# Patient Record
Sex: Female | Born: 1985 | Race: White | Hispanic: No | Marital: Married | Smoking: Never smoker
Health system: Southern US, Community
[De-identification: ages and names within clinical notes are randomized; demographics above are authoritative.]

## PROBLEM LIST (undated history)

## (undated) DIAGNOSIS — E049 Nontoxic goiter, unspecified: Secondary | ICD-10-CM

## (undated) DIAGNOSIS — Z789 Other specified health status: Secondary | ICD-10-CM

## (undated) HISTORY — PX: RHINOPLASTY: SUR1284

## (undated) HISTORY — DX: Nontoxic goiter, unspecified: E04.9

---
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
## 2010-05-17 ENCOUNTER — Inpatient Hospital Stay (HOSPITAL_COMMUNITY): Admission: AD | Admit: 2010-05-17 | Payer: Self-pay | Source: Ambulatory Visit | Admitting: Obstetrics and Gynecology

## 2011-10-19 LAB — OB RESULTS CONSOLE ANTIBODY SCREEN: Antibody Screen: NEGATIVE

## 2011-10-19 LAB — OB RESULTS CONSOLE RPR: RPR: NONREACTIVE

## 2011-11-02 LAB — OB RESULTS CONSOLE GC/CHLAMYDIA: Chlamydia: NEGATIVE

## 2012-04-12 LAB — OB RESULTS CONSOLE GBS: GBS: NEGATIVE

## 2012-05-04 NOTE — L&D Delivery Note (Signed)
Delivery Note At 10:11 AM a viable and healthy female was delivered via Vaginal, Spontaneous Delivery (Presentation: ; Occiput Anterior).  APGAR: 8, 9; weight .   Placenta status: Intact, Spontaneous.  Cord: 3 vessels with the following complications: None.  Cord pH: na  Anesthesia: None  Episiotomy: None Lacerations: None Suture Repair: na Est. Blood Loss (mL): 300  Mom to postpartum.  Baby to nursery-stable.  Cyann Venti J 05/10/2012, 11:09 AM

## 2012-05-10 ENCOUNTER — Encounter (HOSPITAL_COMMUNITY): Payer: Self-pay | Admitting: *Deleted

## 2012-05-10 ENCOUNTER — Inpatient Hospital Stay (HOSPITAL_COMMUNITY)
Admission: AD | Admit: 2012-05-10 | Discharge: 2012-05-12 | DRG: 373 | Disposition: A | Discharge status: Home / Self Care | Payer: BC Managed Care – PPO | Source: Ambulatory Visit | Attending: Obstetrics and Gynecology | Admitting: Obstetrics and Gynecology

## 2012-05-10 HISTORY — DX: Other specified health status: Z78.9

## 2012-05-10 LAB — POCT FERN TEST: POCT Fern Test: POSITIVE

## 2012-05-10 LAB — RPR: RPR Ser Ql: NONREACTIVE

## 2012-05-10 LAB — CBC
MCHC: 34.9 g/dL (ref 30.0–36.0)
Platelets: 171 10*3/uL (ref 150–400)
RDW: 13.1 % (ref 11.5–15.5)

## 2012-05-10 MED ORDER — TETANUS-DIPHTH-ACELL PERTUSSIS 5-2.5-18.5 LF-MCG/0.5 IM SUSP
0.5000 mL | Freq: Once | INTRAMUSCULAR | Status: AC
  Administered 2012-05-11: 0.5 mL via INTRAMUSCULAR
  Filled 2012-05-10: qty 0.5

## 2012-05-10 MED ORDER — OXYCODONE-ACETAMINOPHEN 5-325 MG PO TABS: 1.0000 | ORAL_TABLET | ORAL | Status: DC | PRN

## 2012-05-10 MED ORDER — TERBUTALINE SULFATE 1 MG/ML IJ SOLN: 0.2500 mg | Freq: Once | INTRAMUSCULAR | Status: DC | PRN

## 2012-05-10 MED ORDER — WITCH HAZEL-GLYCERIN EX PADS: 1.0000 "application " | MEDICATED_PAD | CUTANEOUS | Status: DC | PRN

## 2012-05-10 MED ORDER — OXYTOCIN BOLUS FROM INFUSION
500.0000 mL | INTRAVENOUS | Status: DC
  Administered 2012-05-10: 500 mL via INTRAVENOUS

## 2012-05-10 MED ORDER — OXYTOCIN 40 UNITS IN LACTATED RINGERS INFUSION - SIMPLE MED
1.0000 m[IU]/min | INTRAVENOUS | Status: DC
  Filled 2012-05-10: qty 1000

## 2012-05-10 MED ORDER — IBUPROFEN 600 MG PO TABS
600.0000 mg | ORAL_TABLET | Freq: Four times a day (QID) | ORAL | Status: DC
  Administered 2012-05-10 – 2012-05-12 (×8): 600 mg via ORAL
  Filled 2012-05-10 (×8): qty 1

## 2012-05-10 MED ORDER — FLEET ENEMA 7-19 GM/118ML RE ENEM: 1.0000 | ENEMA | RECTAL | Status: DC | PRN

## 2012-05-10 MED ORDER — BENZOCAINE-MENTHOL 20-0.5 % EX AERO
1.0000 "application " | INHALATION_SPRAY | CUTANEOUS | Status: DC | PRN
  Filled 2012-05-10: qty 56

## 2012-05-10 MED ORDER — PHENYLEPHRINE 40 MCG/ML (10ML) SYRINGE FOR IV PUSH (FOR BLOOD PRESSURE SUPPORT): 80.0000 ug | PREFILLED_SYRINGE | INTRAVENOUS | Status: DC | PRN

## 2012-05-10 MED ORDER — FENTANYL 2.5 MCG/ML BUPIVACAINE 1/10 % EPIDURAL INFUSION (WH - ANES): 14.0000 mL/h | INTRAMUSCULAR | Status: DC

## 2012-05-10 MED ORDER — LIDOCAINE HCL (PF) 1 % IJ SOLN
30.0000 mL | INTRAMUSCULAR | Status: DC | PRN
  Administered 2012-05-10: 30 mL via SUBCUTANEOUS
  Filled 2012-05-10: qty 30

## 2012-05-10 MED ORDER — SIMETHICONE 80 MG PO CHEW: 80.0000 mg | CHEWABLE_TABLET | ORAL | Status: DC | PRN

## 2012-05-10 MED ORDER — ONDANSETRON HCL 4 MG PO TABS: 4.0000 mg | ORAL_TABLET | ORAL | Status: DC | PRN

## 2012-05-10 MED ORDER — ONDANSETRON HCL 4 MG/2ML IJ SOLN: 4.0000 mg | Freq: Four times a day (QID) | INTRAMUSCULAR | Status: DC | PRN

## 2012-05-10 MED ORDER — LACTATED RINGERS IV SOLN: 500.0000 mL | INTRAVENOUS | Status: DC | PRN

## 2012-05-10 MED ORDER — CITRIC ACID-SODIUM CITRATE 334-500 MG/5ML PO SOLN: 30.0000 mL | ORAL | Status: DC | PRN

## 2012-05-10 MED ORDER — BUTORPHANOL TARTRATE 1 MG/ML IJ SOLN
1.0000 mg | INTRAMUSCULAR | Status: DC | PRN
  Administered 2012-05-10: 1 mg via INTRAVENOUS

## 2012-05-10 MED ORDER — LACTATED RINGERS IV SOLN
INTRAVENOUS | Status: DC
  Administered 2012-05-10: 08:00:00 via INTRAVENOUS

## 2012-05-10 MED ORDER — EPHEDRINE 5 MG/ML INJ: 10.0000 mg | INTRAVENOUS | Status: DC | PRN

## 2012-05-10 MED ORDER — DIBUCAINE 1 % RE OINT
1.0000 "application " | TOPICAL_OINTMENT | RECTAL | Status: DC | PRN
  Filled 2012-05-10: qty 28

## 2012-05-10 MED ORDER — BUTORPHANOL TARTRATE 1 MG/ML IJ SOLN
INTRAMUSCULAR | Status: AC
  Filled 2012-05-10: qty 1

## 2012-05-10 MED ORDER — DIPHENHYDRAMINE HCL 25 MG PO CAPS: 25.0000 mg | ORAL_CAPSULE | Freq: Four times a day (QID) | ORAL | Status: DC | PRN

## 2012-05-10 MED ORDER — DIPHENHYDRAMINE HCL 50 MG/ML IJ SOLN: 12.5000 mg | INTRAMUSCULAR | Status: DC | PRN

## 2012-05-10 MED ORDER — ACETAMINOPHEN 325 MG PO TABS: 650.0000 mg | ORAL_TABLET | ORAL | Status: DC | PRN

## 2012-05-10 MED ORDER — SENNOSIDES-DOCUSATE SODIUM 8.6-50 MG PO TABS
2.0000 | ORAL_TABLET | Freq: Every day | ORAL | Status: DC
  Administered 2012-05-10 – 2012-05-11 (×2): 2 via ORAL

## 2012-05-10 MED ORDER — LANOLIN HYDROUS EX OINT: TOPICAL_OINTMENT | CUTANEOUS | Status: DC | PRN

## 2012-05-10 MED ORDER — IBUPROFEN 600 MG PO TABS
600.0000 mg | ORAL_TABLET | Freq: Four times a day (QID) | ORAL | Status: DC | PRN
  Administered 2012-05-10: 600 mg via ORAL
  Filled 2012-05-10: qty 1

## 2012-05-10 MED ORDER — METHYLERGONOVINE MALEATE 0.2 MG/ML IJ SOLN: 0.2000 mg | INTRAMUSCULAR | Status: DC | PRN

## 2012-05-10 MED ORDER — PRENATAL MULTIVITAMIN CH
1.0000 | ORAL_TABLET | Freq: Every day | ORAL | Status: DC
  Administered 2012-05-10 – 2012-05-11 (×2): 1 via ORAL
  Filled 2012-05-10 (×2): qty 1

## 2012-05-10 MED ORDER — OXYTOCIN 40 UNITS IN LACTATED RINGERS INFUSION - SIMPLE MED: 62.5000 mL/h | INTRAVENOUS | Status: DC

## 2012-05-10 MED ORDER — METHYLERGONOVINE MALEATE 0.2 MG PO TABS: 0.2000 mg | ORAL_TABLET | ORAL | Status: DC | PRN

## 2012-05-10 MED ORDER — ONDANSETRON HCL 4 MG/2ML IJ SOLN: 4.0000 mg | INTRAMUSCULAR | Status: DC | PRN

## 2012-05-10 MED ORDER — LACTATED RINGERS IV SOLN: 500.0000 mL | Freq: Once | INTRAVENOUS | Status: DC

## 2012-05-10 MED ORDER — ZOLPIDEM TARTRATE 5 MG PO TABS: 5.0000 mg | ORAL_TABLET | Freq: Every evening | ORAL | Status: DC | PRN

## 2012-05-10 NOTE — Progress Notes (Signed)
Monica Cooke is a 27 y.o. G1P0 at [redacted]w[redacted]d by LMP admitted for rupture of membranes  Subjective: Comfortable Objective: BP 125/86  Pulse 68  Temp 98 F (36.7 C) (Oral)  Resp 20  Ht 5\' 3"  (1.6 m)  Wt 77.474 kg (170 lb 12.8 oz)  BMI 30.26 kg/m2      FHT:  FHR: 155 bpm, variability: moderate,  accelerations:  Present,  decelerations:  Absent UC:   irregular, every 5-7 minutes SVE:   Dilation: 3 Effacement (%): 90 Station: -1 Exam by:: Monica Coco RN  Labs: No results found for this basename: WBC, HGB, HCT, MCV, PLT    Assessment / Plan: Protracted latent phase  Labor: Pitocin augmentation Preeclampsia:  na Fetal Wellbeing:  Category I Pain Control:  Labor support without medications I/D:  n/a Anticipated MOD:  NSVD  Monica Cooke J 05/10/2012, 7:58 AM

## 2012-05-10 NOTE — H&P (Signed)
NAME:  Monica Cooke, Monica Cooke NO.:  [id number]  MEDICAL RECORD NO.:  [id number]  LOCATION:                                 FACILITY:  PHYSICIAN:  Lenoard Aden, M.D.DATE OF BIRTH:  11-11-1985  DATE OF ADMISSION: DATE OF DISCHARGE:                             HISTORY & PHYSICAL   CHIEF COMPLAINT:  Spontaneous rupture of membranes at 3 a.m.  HISTORY OF PRESENT ILLNESS:  She is a 27 year old white female, G1, P0, at 35 and 2/7th weeks' gestation who presents with spontaneous rupture of membranes at 3 a.m., irregular contractions noted.  ALLERGIES:  METAMUCIL.  SOCIAL HISTORY:  She is a nonsmoker, nondrinker.  Denies domestic or physical violence.  FAMILY HISTORY:  Hypertension.  SURGICAL HISTORY:  Noncontributory.  PHYSICAL EXAMINATION:  GENERAL:  A well-developed, well-nourished, white female, in no acute distress. HEENT:  Normal. NECK:  Supple.  Full range of motion. LUNGS:  Clear. HEART:  Regular rhythm. ABDOMEN:  Soft, gravid, nontender.  Estimated fetal weight 7.5 pounds. Cervix is 3, 80%, vertex, -1. EXTREMITIES:  There are no cords. NEUROLOGIC:  Nonfocal. SKIN:  Intact.  NST is reactive.  Clear amniotic fluid noted.  IMPRESSION:  Term intrauterine pregnancy with spontaneous rupture of membranes.  PLAN:  Pitocin augmentation, epidural as needed.  Anticipate attempts at vaginal delivery.     Lenoard Aden, M.D.    RJT/MEDQ  D:  05/10/2012  T:  05/10/2012  Job:  [id number]

## 2012-05-10 NOTE — MAU Note (Signed)
Pt G1 at 39.1wks leaking clear fluid since 0338 and having contractions.

## 2012-05-10 NOTE — MAU Note (Signed)
Dr. Billy Coast notified of pt, orders rec'd.

## 2012-05-11 ENCOUNTER — Encounter (HOSPITAL_COMMUNITY): Payer: Self-pay | Admitting: *Deleted

## 2012-05-11 LAB — CBC
MCH: 31.8 pg (ref 26.0–34.0)
MCHC: 34.1 g/dL (ref 30.0–36.0)
MCV: 93.3 fL (ref 78.0–100.0)
Platelets: 160 10*3/uL (ref 150–400)
RBC: 3.74 MIL/uL — ABNORMAL LOW (ref 3.87–5.11)
RDW: 13.4 % (ref 11.5–15.5)

## 2012-05-11 NOTE — Progress Notes (Signed)
Patient ID: Monica Cooke, female   DOB: 1985/05/18, 27 y.o.   MRN: [id number] PPD # 1  Subjective: Pt reports feeling well/ Pain controlled with ibuprofen Tolerating po/ Voiding without problems/ No n/v Bleeding is light Newborn info:  Information for the patient's newborn:  Demos, Girl [name] [[id number]]  female Feeding: breast [name])   Objective:  VS: Blood pressure 108/69, pulse 77, temperature 98.1 F (36.7 C), temperature source Oral, resp. rate 18.    Basename 05/11/12 0530 05/10/12 0725  WBC 12.1* 11.1*  HGB 11.9* 13.9  HCT 34.9* 39.8  PLT 160 171    Blood type: --/--/O POS (01/07 0725) Rubella: Immune (06/17 0000)    Physical Exam:  General: A & O x 3  alert, cooperative and no distress CV: Regular rate and rhythm Resp: clear Abdomen: soft, nontender, normal bowel sounds Uterine Fundus: firm, below umbilicus, nontender Perineum: intact Lochia: minimal Ext: edema trace and Homans sign is negative, no sign of DVT   A/P: PPD # 1/ G1P1001/ S/P:spontaneous vaginal delivery Doing well Continue routine post partum orders Anticipate D/C home in AM    Demetrius Revel, MSN, [facility] 05/11/2012, 9:47 AM

## 2012-05-12 MED ORDER — IBUPROFEN 600 MG PO TABS: 300.0000 mg | ORAL_TABLET | Freq: Four times a day (QID) | ORAL | Status: DC | PRN

## 2012-05-12 NOTE — Progress Notes (Signed)
Post Partum Day 2 NSVD without complications viable female. Subjective: no complaints, up ad lib without syncope, voiding, tolerating PO, + flatus, +BM  Pain well controlled with po meds, taking motrin and percocet  BF: on demand Mood stable, bonding well    Objective: Blood pressure 107/70, pulse 83, temperature 98.5 F (36.9 C), temperature source Oral, resp. rate 18, height 5\' 3"  (1.6 m), weight 170 lb 12.8 oz (77.474 kg), SpO2 98.00%, unknown if currently breastfeeding.  Physical Exam:  General: alert, cooperative and no distress Breasts: Nipple tenderness but coping well - nipples are not cracked Lungs: CTAB Heart: RRR Lochia: appropriate, Min, Rubra Uterine Fundus: firm, -3 /u Perineum: intact but slight bruised- healing well DVT Evaluation: No evidence of DVT seen on physical exam. Negative Homan's sign. No cords or calf tenderness. No significant calf/ankle edema.   Basename 05/11/12 0530 05/10/12 0725  HGB 11.9* 13.9  HCT 34.9* 39.8    Assessment/Plan: Plan for discharge tomorrow       LOS: 2 days   Alp Goldwater 05/12/2012, 9:32 AM

## 2012-05-12 NOTE — Discharge Summary (Signed)
Physician Discharge Summary  Patient ID: LACYE MCCARN MRN: [id number] DOB/AGE: 1986-04-22 26 y.o.  Admit date: 05/10/2012 Discharge date: 05/12/2012  Admission Diagnoses: Patient admitted with SROM and active labor. GBS - neg  Discharge Diagnoses:  Active Problems:  NSVD (normal spontaneous vaginal delivery)   Discharged Condition: stable  Hospital Course: Active labor with NSVD.Uncomplicated course.  Consults: None  Significant Diagnostic Studies: labs: Routine Prenatal Labs - normal and radiology: Ultrasound: anatomy normal  Treatments: analgesia: ibuprofen   Discharge Exam: Blood pressure 107/70, pulse 83, temperature 98.5 F (36.9 C), temperature source Oral, resp. rate 18, height 5\' 3"  (1.6 m), weight 170 lb 12.8 oz (77.474 kg), SpO2 98.00%, unknown if currently breastfeeding.  Physical Examination:  General appearance: alert, cooperative and no distress Affect: AAO x 3 Lungs: CTAB Breasts: nipples slightly tender but not cracked. CV: RRR Abdomen: Soft, N/T with flatus ++ Fundus: -3/u, Firm Lochia: min, rubra. GI: tolerating normal diet GU: no problems voiding Extremities: No swelling/ no edema Bilaterally.  Disposition: Final discharge disposition not confirmed - to home with baby.  Discharge Orders    Future Orders Please Complete By Expires   Diet general      Discharge instructions      Comments:   Per [facility] Booklet       Medication List     As of 05/12/2012  9:51 AM    TAKE these medications         ibuprofen 600 MG tablet   Commonly known as: ADVIL,MOTRIN   Take 0.5 tablets (300 mg total) by mouth every 6 (six) hours as needed for pain.      multivitamin-prenatal 27-0.8 MG Tabs   Take 1 tablet by mouth daily.           Follow-up Information    Follow up with [facility] Schedule an appointment as soon as possible for a visit in 6 weeks. (As needed)    Contact information:   [address] [place]  [place] [address] [phone]         Signed: Earl Gala, CNM. 05/12/2012, 9:51 AM

## 2014-03-05 ENCOUNTER — Encounter (HOSPITAL_COMMUNITY): Payer: Self-pay | Admitting: *Deleted

## 2016-02-24 ENCOUNTER — Ambulatory Visit (INDEPENDENT_AMBULATORY_CARE_PROVIDER_SITE_OTHER): Payer: Self-pay | Admitting: Internal Medicine

## 2016-02-24 ENCOUNTER — Encounter: Payer: Self-pay | Admitting: Internal Medicine

## 2016-02-24 VITALS — BP 110/80 | HR 82 | Ht 63.0 in | Wt 145.0 lb

## 2016-02-24 DIAGNOSIS — E041 Nontoxic single thyroid nodule: Secondary | ICD-10-CM

## 2016-02-24 LAB — T3, FREE: T3 FREE: 3.2 pg/mL (ref 2.3–4.2)

## 2016-02-24 LAB — T4, FREE: FREE T4: 0.88 ng/dL (ref 0.60–1.60)

## 2016-02-24 LAB — TSH: TSH: 0.27 u[IU]/mL — AB (ref 0.35–4.50)

## 2016-02-24 NOTE — Progress Notes (Addendum)
Patient ID: Monica MouseLaura C Wegner, female   DOB: [birth year], 30 y.o.   MRN: [id number]    HPI  Monica Cooke is a 30 y.o.-year-old female, referred by Dr. Billy Coastaavon, for evaluation for a left thyroid nodule.   She had a thyroid nodule noticed at a physical exam 10/2014 by PCP. She had a [facility]: 2.3 cm L nodule. She was sent to ENT, but did not go as she lost her insurance.  She now has insurance >> saw Dr. Billy Coastaavon >> he also felt the thyroid nodule >> referred to endocrinology.  No Thyroid U/S images or report available for review  Pt does feel the nodule when she touches her neck, but she denies: - hoarseness - dysphagia - choking - SOB with lying down She does clear her throat  - always had this.   I reviewed pt's thyroid tests: 10/16/2014: TSH 1.35, free T4 1.08, free T3 3.26  Pt c/o: - + fatigue - No heat intolerance/cold intolerance - no tremors - + Occasional palpitations - no anxiety/depression - no hyperdefecation/constipation - + Both: weight loss/weight gain - no dry skin - no hair loss  No FH of thyroid ds. No FH of thyroid cancer. No h/o radiation tx to head or neck.  No seaweed or kelp. No recent contrast studies. No steroid use. No herbal supplements. No Biotin supplements but took Hair, Skin and Nails vitamins - stopped 2 mo ago.  Pt also has a history of mother - goiter.  She exercises 2-3 times a week: Walking, Zumba, Pilates.   She also has a history of loss of consciousness with dehydration when she was younger.  She her husband would like to start trying to conceive again.  ROS: Constitutional: See history of present illness Eyes: no blurry vision, no xerophthalmia ENT: no sore throat, see history of present illness Cardiovascular: no CP/SOB/+ occasional palpitations/no leg swelling Respiratory: no cough/SOB Gastrointestinal: no N/V/D/C Musculoskeletal: no muscle/joint aches Skin: no rashes Neurological: no  tremors/numbness/tingling/dizziness Psychiatric: no depression/anxiety  Past Medical History:  Diagnosis Date  . No pertinent past medical history    Past Surgical History:  Procedure Laterality Date  . RHINOPLASTY     Social History   Social History  . Marital status: Married    Spouse name: N/A  . Number of children: 1   Occupational History  . LPN   Social History Main Topics  . Smoking status: Never Smoker  . Smokeless tobacco: No  . Alcohol use Drinks 1-2 glasses of wine occasionally   . Drug use: No   Current Outpatient Prescriptions on File Prior to Visit  Medication Sig Dispense Refill  . ibuprofen (ADVIL,MOTRIN) 600 MG tablet Take 0.5 tablets (300 mg total) by mouth every 6 (six) hours as needed for pain. 30 tablet    Allergies  Allergen Reactions  . Metamucil [Psyllium] Shortness Of Breath   Family History  Problem Relation Age of Onset  . Thyroid disease Mother   . Hyperlipidemia Mother   . Hypertension Mother   . Hyperlipidemia Father   . Hypertension Father   . Diabetes Father   . Other Neg Hx     PE: BP 110/80 (BP Location: Left Arm, Patient Position: Sitting)   Pulse 82   Ht 5\' 3"  (1.6 m)   Wt 145 lb (65.8 kg)   LMP 02/18/2016   SpO2 96%   BMI 25.69 kg/m  Wt Readings from Last 3 Encounters:  02/24/16 145 lb (65.8 kg)  05/10/12 170 lb 12.8 oz (77.5 kg)   Constitutional: normal weight, in NAD Eyes: PERRLA, EOMI, no exophthalmos ENT: moist mucous membranes, + Large mobile nodule noticed with swallowing and on palpation: Central low cervical region with extension to the left, no cervical lymphadenopathy Cardiovascular: RRR, No MRG Respiratory: CTA B Gastrointestinal: abdomen soft, NT, ND, BS+ Musculoskeletal: no deformities, strength intact in all 4;  Skin: moist, warm, no rashes Neurological: no tremor with outstretched hands, DTR normal in all 4  ASSESSMENT: 1. Left thyroid nodule  PLAN: 1.  - Patient with a history of 0.3 cm  nodule in the left thyroid lobe reportedly. Unfortunately, I do not have records of the thyroid ultrasound from 10/2014. We will try to obtain this. However, we discussed that we will need to obtain another ultrasound to see if the nodule changed over time. I pointed out that the dominant nodules are large, this being a risk factor for cancer.  We discussed about other possible worrisome characteristics - I will need to review the images to see if her nodule has the following: - hypoechogenicity - Presence of microcalcifications - Presence of internal blood flow - more wide than tall - Infiltrative margins Pt does not have a thyroid cancer family history or a personal history of RxTx to head/neck. All these would favor benignity.  - the only way that we can tell exactly if it is cancer or not is by doing a thyroid biopsy (FNA). I explained what the test entails. We may need a thyroid biopsy if the nodule is not cystic. If the nodule is cystic, since she does not have neck compression symptoms, I would not recommend drainage/FNA for now. However, if the nodule is solid, we will most likely need a biopsy. She agrees with this.  - We discussed about what the biopsy entails and possible results. - We also discussed about the possible diagnosis of thyroid cancer, the fact that it is very indolent and usually does not decreased life expectancy especially in somebody this young. - I explained that this is not cancer, we can continue to follow her on a yearly basis, and check another ultrasound in another year or 2. - for today, will check her TFTs - I'll see her back in a year, assuming her FNA is normal. If FNA abnormal, we will meet sooner.  - I advised pt to join my chart and I will send her the results through there   Orders Placed This Encounter  Procedures  . T4, free  . T3, free  . TSH   Component     Latest Ref Rng & Units 02/24/2016  T4,Free(Direct)     0.60 - 1.60 ng/dL 1.61   Triiodothyronine,Free,Serum     2.3 - 4.2 pg/mL 3.2  TSH     0.35 - 4.50 uIU/mL 0.27 (L)   As TSH is low (Subclinical hyperthyroidism) >> will get a thyroid uptake and scan first rather than a thyroid U/S.  Addendum, 06/29/16 TFTs normalized in 05/2016, therefore, thyroid ultrasound was obtained >> small nodules, not worrisome: US SOFT TISSUE HEAD AND NECK  Order: [id number]  Status:  Final result Visible to patient:  No (Not Released) Dx:  Thyroid nodule  Details   Reading Physician Reading Date Result Priority  Gilmer Mor, DO 06/26/2016   Narrative    CLINICAL DATA: 30 year old female with a history of thyroid nodule  EXAM: THYROID ULTRASOUND  TECHNIQUE: Ultrasound examination of the thyroid gland and adjacent soft tissues was performed.  COMPARISON: 10/26/2014  FINDINGS: Parenchymal Echotexture: Mildly heterogenous  Isthmus: 0.5 cm  Right lobe: 6.1 cm x 1.0 cm x 1.6 cm  Left lobe: 6.1 cm x 1.8 cm x 2.8 cm  _________________________________________________________  Estimated total number of nodules >/= 1 cm: 3  Number of spongiform nodules >/= 2 cm not described below (TR1): 0  Number of mixed cystic and solid nodules >/= 1.5 cm not described below (TR2): 0  Nodule # 1:  Location: Isthmus; Mid  Maximum size: 4.8 cm; Other 2 dimensions: 1.5 cm x 2.8 cm  Composition: cystic/almost completely cystic (0)  Echogenicity: isoechoic (1)  Shape: not taller-than-wide (0)  Margins: smooth (0)  Echogenic foci: none (0)  ACR TI-RADS total points: 1.  ACR TI-RADS risk category: TR1 (0-1 points).  ACR TI-RADS recommendations:  Colloid cyst/nodule does not meet criteria for biopsy or surveillance.  Nodule # 1:  Location: Right; Superior  Maximum size: 0.9 cm; Other 2 dimensions: 0.4 cm x 0.5 cm  Composition: cystic/almost completely cystic (0)  Echogenicity: anechoic (0)  Shape: not taller-than-wide (0)  Margins: smooth (0)  Echogenic foci:  none (0)  ACR TI-RADS total points: 0.  ACR TI-RADS risk category: TR1 (0-1 points).  ACR TI-RADS recommendations:  Colloid cyst/nodule does not meet criteria for biopsy or surveillance  Nodule # 2:  Location: Right; Mid  Maximum size: 0.5 cm; Other 2 dimensions: 0.3 cm x 0.4 cm  Composition: cystic/almost completely cystic (0)  Echogenicity: anechoic (0)  Shape: not taller-than-wide (0)  Margins: smooth (0)  Echogenic foci: none (0)  ACR TI-RADS total points: 0.  ACR TI-RADS risk category: TR1 (0-1 points).  ACR TI-RADS recommendations:  Cystic nodule does not meet criteria for surveillance or biopsy  Nodule # 3:  Location: Right; Inferior  Maximum size: 0.7 cm; Other 2 dimensions: 0.5 cm x 0.5 cm  Composition: cystic/almost completely cystic (0)  Echogenicity: anechoic (0)  Shape: not taller-than-wide (0)  Margins: smooth (0)  Echogenic foci: none (0)  ACR TI-RADS total points: 0.  ACR TI-RADS risk category: TR1 (0-1 points).  ACR TI-RADS recommendations:  Cystic/colloid nodule does not meet criteria for surveillance or biopsy  Nodule # 1:  Location: Left; Mid  Maximum size: 2.4 cm; Other 2 dimensions: 1.3 cm x 1.7 cm  Composition: cystic/almost completely cystic (0)  Echogenicity: anechoic (0)  Shape: not taller-than-wide (0)  Margins: smooth (0)  Echogenic foci: none (0)  ACR TI-RADS total points: 0.  ACR TI-RADS risk category: TR1 (0-1 points).  ACR TI-RADS recommendations:  Cystic/colloid nodule does not meet criteria for surveillance or biopsy  Nodule # 2:  Location: Left; Inferior  Maximum size: 1.3 cm; Other 2 dimensions: 0.8 cm x 1.3 cm  Composition: mixed cystic and solid (1)  Echogenicity: isoechoic (1)  Shape: not taller-than-wide (0)  Margins: smooth (0)  Echogenic foci: none (0)  ACR TI-RADS total points: 2.  ACR TI-RADS risk category: TR2 (2 points).  ACR TI-RADS recommendations:  Cystic nodule  does not meet criteria for surveillance or biopsy  IMPRESSION: Multiple cystic/ colloid nodules, with no nodule meeting criteria for biopsy or surveillance, as designated by the newly established ACR TI-RADS criteria.  Recommendations follow those established by the new ACR TI-RADS criteria (J Am Coll Radiol 2017;14:587-595).  Signed,  Yvone Neu. Loreta Ave, DO  [facility]  [facility]   Electronically Signed By: Gilmer Mor D.O. On: 06/26/2016 17:06        Carlus Pavlov, MD PhD  [facility]

## 2016-02-24 NOTE — Patient Instructions (Signed)
Please stop at the lab.  We will schedule a thyroid U/S after results are back.  Please return in 1 year.   Thyroid Biopsy The thyroid gland is a butterfly-shaped gland located in the front of the neck. It produces hormones that affect metabolism, growth and development, and body temperature. Thyroid biopsy is a procedure in which small samples of tissue or fluid are removed from the thyroid gland. The samples are then looked at under a microscope to check for abnormalities. This procedure is done to determine the cause of thyroid problems. It may be done to check for infection, cancer, or other thyroid problems. Two methods may be used for a thyroid biopsy. In one method, a thin needle is inserted through the skin and into the thyroid gland. In the other method, an open incision is made through the skin. LET [facility] PROVID[facility] KNOW ABOUT:   Any allergies you have.  All medicines you are taking, including vitamins, herbs, eye drops, creams, and over-the-counter medicines.  Previous problems you or members of your family have had with the use of anesthetics.  Any blood disorders you have.  Previous surgeries you have had.  Medical conditions you have. RISKS AND COMPLICATIONS Generally, this is a safe procedure. However, problems can occur and include:  Bleeding from the procedure site.  Infection.  Injury to structures near the thyroid gland. BEFORE THE PROCEDURE   Ask your health care provider about:  Changing or stopping your regular medicines. This is especially important if you are taking diabetes medicines or blood thinners.  Taking medicines such as aspirin and ibuprofen. These medicines can thin your blood. Do not take these medicines before your procedure if your health care provider asks you not to.  Do not eat or drink anything after midnight on the night before the procedure or as directed by your health care provider.  You may have a blood sample  taken. PROCEDURE Either of these methods may be used to perform a thyroid biopsy:  Fine needle biopsy. You may be given medicine to help you relax (sedative). You will be asked to lie on your back with your head tipped backward to extend your neck. An area on your neck will be cleaned. A needle will then be inserted through the skin of your neck. You may be asked to avoid coughing, talking, swallowing, or making sounds during some portions of the procedure. The needle will be withdrawn once the tissue or fluid samples have been removed. Pressure may be applied to your neck to reduce swelling and ensure that bleeding has stopped. The samples will be sent to a lab for examination.  Open biopsy. You will be given medicine to make you sleep (general anesthetic). An incision will be made in your neck. A sample of thyroid tissue will be removed using surgical tools. The tissue sample will be sent for examination. In some cases, the sample may be examined during the biopsy. If that is done and cancer cells are found, some or all of the thyroid gland may be removed. The incision will be closed with stitches. AFTER THE PROCEDURE   Your recovery will be assessed and monitored.  You may have soreness and tenderness at the site of the biopsy. This should go away after a few days.  If you had an open biopsy, you may have a hoarse voice or sore throat for a couple days.  It is your responsibility to get your test results.   This information is  not intended to replace advice given to you by your health care provider. Make sure you discuss any questions you have with your health care provider.   Document Released: 02/15/2007 Document Revised: 05/11/2014 Document Reviewed: 07/13/2013 Elsevier Interactive Patient Education [employer]

## 2016-02-25 ENCOUNTER — Telehealth: Payer: Self-pay

## 2016-02-25 NOTE — Telephone Encounter (Signed)
Called patient and spoke about lab results. Advised of uptake and scan, no other questions at this time.

## 2016-03-30 ENCOUNTER — Encounter (HOSPITAL_COMMUNITY): Payer: BLUE CROSS/BLUE SHIELD

## 2016-03-31 ENCOUNTER — Encounter (HOSPITAL_COMMUNITY): Payer: BLUE CROSS/BLUE SHIELD

## 2016-05-01 ENCOUNTER — Telehealth: Payer: Self-pay | Admitting: Internal Medicine

## 2016-05-01 NOTE — Telephone Encounter (Signed)
Patient stated she is ready to reschedule her appt for Monica Cooke - NM THYROID SNG UPTAKE W/IMAGING.  Please advise

## 2016-05-06 ENCOUNTER — Telehealth: Payer: Self-pay

## 2016-05-06 DIAGNOSIS — E041 Nontoxic single thyroid nodule: Secondary | ICD-10-CM

## 2016-05-06 NOTE — Telephone Encounter (Signed)
Called and left message for patient that she needed labs drawn first before uptake and scan. Gave call back number to office to schedule. Lab orders placed.

## 2016-05-06 NOTE — Telephone Encounter (Signed)
Let;s have her back for labs first: TSH, fT4 and fT3

## 2016-05-06 NOTE — Telephone Encounter (Signed)
Called and notified patient she needed labs drawn first before uptake and scan. Gave call back number to office to schedule. Lab orders placed.

## 2016-05-19 ENCOUNTER — Telehealth: Payer: Self-pay

## 2016-05-19 NOTE — Telephone Encounter (Signed)
Called patient and spoke with her about no showing the uptake and scan. Patient states that she found out even with insurance it was going to be $800, and at Christmas time she could not afford to do that at the time. Patient did schedule a lab appointment since she had not done that either and is coming next week to get those drawn since orders are still good. Would like to reschedule the uptake and scan if it is still needed. Thank you!  

## 2016-05-19 NOTE — Telephone Encounter (Signed)
Called patient and spoke with her about no showing the uptake and scan. Patient states that she found out even with insurance it was going to be $800, and at Christmas time she could not afford to do that at the time. Patient did schedule a lab appointment since she had not done that either and is coming next week to get those drawn since orders are still good. Would like to reschedule the uptake and scan if it is still needed. Thank you!

## 2016-05-19 NOTE — Telephone Encounter (Signed)
Okay, noted, let's get those labs checked again and then will decide about the uptake and scan.

## 2016-05-19 NOTE — Telephone Encounter (Signed)
Monica Cooke, Can you please try to get in touch with her again? I don't think she got our messages as she no showed the thyroid uptake and scan.

## 2016-05-25 ENCOUNTER — Other Ambulatory Visit (INDEPENDENT_AMBULATORY_CARE_PROVIDER_SITE_OTHER): Payer: BLUE CROSS/BLUE SHIELD

## 2016-05-25 DIAGNOSIS — E041 Nontoxic single thyroid nodule: Secondary | ICD-10-CM | POA: Diagnosis not present

## 2016-05-25 LAB — TSH: TSH: 0.7 u[IU]/mL (ref 0.35–4.50)

## 2016-05-25 LAB — T4, FREE: FREE T4: 0.74 ng/dL (ref 0.60–1.60)

## 2016-05-25 LAB — T3, FREE: T3 FREE: 2.6 pg/mL (ref 2.3–4.2)

## 2016-05-26 NOTE — Telephone Encounter (Signed)
-----   Message from Carlus Pavlovristina Gherghe, MD sent at 05/25/2016  5:25 PM EST ----- Raynelle FanningJulie, can you please call pt: Her TFTs have normalized. We can go ahead with a thyroid ultrasound rather than the uptake and scan. Does she agree to have the thyroid ultrasound?

## 2016-05-26 NOTE — Telephone Encounter (Signed)
Called patient and gave results. Patient would like to have the ultrasound done, and I advised they would contact her with an appointment when you placed the order. Patient had no other questions at this time.

## 2016-05-27 ENCOUNTER — Other Ambulatory Visit: Payer: Self-pay | Admitting: Internal Medicine

## 2016-05-27 DIAGNOSIS — E041 Nontoxic single thyroid nodule: Secondary | ICD-10-CM

## 2016-05-27 NOTE — Telephone Encounter (Signed)
I ordered the US.

## 2016-06-26 ENCOUNTER — Ambulatory Visit
Admission: RE | Admit: 2016-06-26 | Discharge: 2016-06-26 | Disposition: A | Discharge status: Home / Self Care | Payer: BLUE CROSS/BLUE SHIELD | Source: Ambulatory Visit | Attending: Internal Medicine | Admitting: Internal Medicine

## 2016-06-26 DIAGNOSIS — E041 Nontoxic single thyroid nodule: Secondary | ICD-10-CM | POA: Diagnosis not present

## 2016-06-30 ENCOUNTER — Telehealth: Payer: Self-pay

## 2016-06-30 NOTE — Telephone Encounter (Signed)
-----   Message from Carlus Pavlovristina Gherghe, MD sent at 06/30/2016 12:53 PM EST ----- Raynelle FanningJulie, can you please call pt: She has several thyroid cysts, of which, the largest one in the front of the thyroid (in the isthmus). No worrisome features. We do not need to Bx them or even to follow them unless she develops neck compression sxs; problems with swallowing, cough, choking, shortness of breath with lying down.

## 2016-06-30 NOTE — Telephone Encounter (Signed)
Called patient and gave lab results. Patient had no questions or concerns.  

## 2016-07-20 DIAGNOSIS — J111 Influenza due to unidentified influenza virus with other respiratory manifestations: Secondary | ICD-10-CM | POA: Diagnosis not present

## 2016-09-10 DIAGNOSIS — Z3201 Encounter for pregnancy test, result positive: Secondary | ICD-10-CM | POA: Diagnosis not present

## 2016-10-08 DIAGNOSIS — Z3689 Encounter for other specified antenatal screening: Secondary | ICD-10-CM | POA: Diagnosis not present

## 2016-10-08 DIAGNOSIS — Z3481 Encounter for supervision of other normal pregnancy, first trimester: Secondary | ICD-10-CM | POA: Diagnosis not present

## 2016-10-08 LAB — OB RESULTS CONSOLE RPR: RPR: NONREACTIVE

## 2016-10-08 LAB — OB RESULTS CONSOLE ABO/RH: RH Type: POSITIVE

## 2016-10-08 LAB — OB RESULTS CONSOLE GC/CHLAMYDIA
CHLAMYDIA, DNA PROBE: NEGATIVE
Gonorrhea: NEGATIVE

## 2016-10-08 LAB — OB RESULTS CONSOLE HEPATITIS B SURFACE ANTIGEN: Hepatitis B Surface Ag: NEGATIVE

## 2016-10-08 LAB — OB RESULTS CONSOLE ANTIBODY SCREEN: ANTIBODY SCREEN: NEGATIVE

## 2016-10-08 LAB — OB RESULTS CONSOLE RUBELLA ANTIBODY, IGM: Rubella: IMMUNE

## 2016-10-08 LAB — OB RESULTS CONSOLE HIV ANTIBODY (ROUTINE TESTING): HIV: NONREACTIVE

## 2016-10-09 DIAGNOSIS — Z3201 Encounter for pregnancy test, result positive: Secondary | ICD-10-CM | POA: Diagnosis not present

## 2016-10-19 DIAGNOSIS — Z36 Encounter for antenatal screening for chromosomal anomalies: Secondary | ICD-10-CM | POA: Diagnosis not present

## 2016-10-19 DIAGNOSIS — Z3689 Encounter for other specified antenatal screening: Secondary | ICD-10-CM | POA: Diagnosis not present

## 2016-10-19 DIAGNOSIS — Z3481 Encounter for supervision of other normal pregnancy, first trimester: Secondary | ICD-10-CM | POA: Diagnosis not present

## 2016-10-19 DIAGNOSIS — Z3682 Encounter for antenatal screening for nuchal translucency: Secondary | ICD-10-CM | POA: Diagnosis not present

## 2016-10-19 DIAGNOSIS — Z3491 Encounter for supervision of normal pregnancy, unspecified, first trimester: Secondary | ICD-10-CM | POA: Diagnosis not present

## 2016-11-11 DIAGNOSIS — Z3481 Encounter for supervision of other normal pregnancy, first trimester: Secondary | ICD-10-CM | POA: Diagnosis not present

## 2016-11-11 DIAGNOSIS — Z3682 Encounter for antenatal screening for nuchal translucency: Secondary | ICD-10-CM | POA: Diagnosis not present

## 2017-02-23 ENCOUNTER — Ambulatory Visit: Payer: Self-pay | Admitting: Internal Medicine

## 2017-04-15 LAB — OB RESULTS CONSOLE GBS: GBS: POSITIVE

## 2017-05-04 NOTE — L&D Delivery Note (Signed)
Delivery Note At 4:07 PM a viable and healthy female was delivered via Vaginal, Spontaneous (Presentation: LOA  ).  APGAR: 8, 9; weight pending .   Placenta status: spontaneous, intact.  Cord:  with the following complications: none.  Cord pH: na  Anesthesia:  local Episiotomy:  none Lacerations:  na Suture Repair: na Est. Blood Loss (mL):  100  Mom to postpartum.  Baby to Couplet care / Skin to Skin.  Kamilla Hands J 05/14/2017, 4:24 PM

## 2017-05-12 ENCOUNTER — Other Ambulatory Visit: Payer: Self-pay | Admitting: Obstetrics and Gynecology

## 2017-05-13 ENCOUNTER — Encounter (HOSPITAL_COMMUNITY): Payer: Self-pay | Admitting: *Deleted

## 2017-05-13 ENCOUNTER — Telehealth (HOSPITAL_COMMUNITY): Payer: Self-pay | Admitting: *Deleted

## 2017-05-13 NOTE — Telephone Encounter (Signed)
Preadmission screen  

## 2017-05-14 ENCOUNTER — Encounter (HOSPITAL_COMMUNITY): Payer: Self-pay

## 2017-05-14 ENCOUNTER — Inpatient Hospital Stay (HOSPITAL_COMMUNITY)
Admission: RE | Admit: 2017-05-14 | Discharge: 2017-05-16 | DRG: 807 | Disposition: A | Discharge status: Home / Self Care | Payer: Managed Care, Other (non HMO) | Source: Ambulatory Visit | Attending: Obstetrics and Gynecology | Admitting: Obstetrics and Gynecology

## 2017-05-14 DIAGNOSIS — Z3A40 40 weeks gestation of pregnancy: Secondary | ICD-10-CM

## 2017-05-14 DIAGNOSIS — Z3490 Encounter for supervision of normal pregnancy, unspecified, unspecified trimester: Secondary | ICD-10-CM | POA: Diagnosis present

## 2017-05-14 DIAGNOSIS — O99824 Streptococcus B carrier state complicating childbirth: Secondary | ICD-10-CM | POA: Diagnosis present

## 2017-05-14 HISTORY — DX: Other specified health status: Z78.9

## 2017-05-14 LAB — CBC
HEMATOCRIT: 37.9 % (ref 36.0–46.0)
HEMOGLOBIN: 13.4 g/dL (ref 12.0–15.0)
MCH: 32.2 pg (ref 26.0–34.0)
MCHC: 35.4 g/dL (ref 30.0–36.0)
MCV: 91.1 fL (ref 78.0–100.0)
Platelets: 182 10*3/uL (ref 150–400)
RBC: 4.16 MIL/uL (ref 3.87–5.11)
RDW: 13.3 % (ref 11.5–15.5)
WBC: 8.1 10*3/uL (ref 4.0–10.5)

## 2017-05-14 LAB — TYPE AND SCREEN
ABO/RH(D): O POS
Antibody Screen: NEGATIVE

## 2017-05-14 LAB — RPR: RPR: NONREACTIVE

## 2017-05-14 MED ORDER — LACTATED RINGERS IV SOLN
INTRAVENOUS | Status: DC
  Administered 2017-05-14: 08:00:00 via INTRAVENOUS

## 2017-05-14 MED ORDER — METHYLERGONOVINE MALEATE 0.2 MG/ML IJ SOLN: 0.2000 mg | INTRAMUSCULAR | Status: DC | PRN

## 2017-05-14 MED ORDER — TERBUTALINE SULFATE 1 MG/ML IJ SOLN
0.2500 mg | Freq: Once | INTRAMUSCULAR | Status: DC | PRN
  Filled 2017-05-14: qty 1

## 2017-05-14 MED ORDER — ONDANSETRON HCL 4 MG/2ML IJ SOLN: 4.0000 mg | Freq: Four times a day (QID) | INTRAMUSCULAR | Status: DC | PRN

## 2017-05-14 MED ORDER — OXYTOCIN 40 UNITS IN LACTATED RINGERS INFUSION - SIMPLE MED: 1.0000 m[IU]/min | INTRAVENOUS | Status: DC

## 2017-05-14 MED ORDER — TETANUS-DIPHTH-ACELL PERTUSSIS 5-2.5-18.5 LF-MCG/0.5 IM SUSP: 0.5000 mL | Freq: Once | INTRAMUSCULAR | Status: DC

## 2017-05-14 MED ORDER — PENICILLIN G POT IN DEXTROSE 60000 UNIT/ML IV SOLN
3.0000 10*6.[IU] | INTRAVENOUS | Status: DC
  Administered 2017-05-14: 3 10*6.[IU] via INTRAVENOUS
  Filled 2017-05-14 (×4): qty 50

## 2017-05-14 MED ORDER — SOD CITRATE-CITRIC ACID 500-334 MG/5ML PO SOLN: 30.0000 mL | ORAL | Status: DC | PRN

## 2017-05-14 MED ORDER — BENZOCAINE-MENTHOL 20-0.5 % EX AERO: 1.0000 "application " | INHALATION_SPRAY | CUTANEOUS | Status: DC | PRN

## 2017-05-14 MED ORDER — DIPHENHYDRAMINE HCL 25 MG PO CAPS: 25.0000 mg | ORAL_CAPSULE | Freq: Four times a day (QID) | ORAL | Status: DC | PRN

## 2017-05-14 MED ORDER — ONDANSETRON HCL 4 MG PO TABS
4.0000 mg | ORAL_TABLET | ORAL | Status: DC | PRN
Start: 2017-05-14 — End: 2017-05-16

## 2017-05-14 MED ORDER — COCONUT OIL OIL
1.0000 "application " | TOPICAL_OIL | Status: DC | PRN
  Administered 2017-05-15: 1 via TOPICAL
  Filled 2017-05-14: qty 120

## 2017-05-14 MED ORDER — PRENATAL MULTIVITAMIN CH
1.0000 | ORAL_TABLET | Freq: Every day | ORAL | Status: DC
  Filled 2017-05-14 (×2): qty 1

## 2017-05-14 MED ORDER — ZOLPIDEM TARTRATE 5 MG PO TABS: 5.0000 mg | ORAL_TABLET | Freq: Every evening | ORAL | Status: DC | PRN

## 2017-05-14 MED ORDER — IBUPROFEN 600 MG PO TABS
600.0000 mg | ORAL_TABLET | Freq: Four times a day (QID) | ORAL | Status: DC
  Administered 2017-05-14 – 2017-05-16 (×8): 600 mg via ORAL
  Filled 2017-05-14 (×8): qty 1

## 2017-05-14 MED ORDER — METHYLERGONOVINE MALEATE 0.2 MG PO TABS: 0.2000 mg | ORAL_TABLET | ORAL | Status: DC | PRN

## 2017-05-14 MED ORDER — WITCH HAZEL-GLYCERIN EX PADS: 1.0000 "application " | MEDICATED_PAD | CUTANEOUS | Status: DC | PRN

## 2017-05-14 MED ORDER — SENNOSIDES-DOCUSATE SODIUM 8.6-50 MG PO TABS
2.0000 | ORAL_TABLET | ORAL | Status: DC
  Administered 2017-05-15 (×2): 2 via ORAL
  Filled 2017-05-14 (×2): qty 2

## 2017-05-14 MED ORDER — PENICILLIN G POTASSIUM 5000000 UNITS IJ SOLR
5.0000 10*6.[IU] | Freq: Once | INTRAMUSCULAR | Status: AC
  Administered 2017-05-14: 5 10*6.[IU] via INTRAVENOUS
  Filled 2017-05-14: qty 5

## 2017-05-14 MED ORDER — OXYCODONE-ACETAMINOPHEN 5-325 MG PO TABS: 2.0000 | ORAL_TABLET | ORAL | Status: DC | PRN

## 2017-05-14 MED ORDER — OXYTOCIN 40 UNITS IN LACTATED RINGERS INFUSION - SIMPLE MED: 2.5000 [IU]/h | INTRAVENOUS | Status: DC

## 2017-05-14 MED ORDER — OXYTOCIN 40 UNITS IN LACTATED RINGERS INFUSION - SIMPLE MED
1.0000 m[IU]/min | INTRAVENOUS | Status: DC
  Administered 2017-05-14: 2 m[IU]/min via INTRAVENOUS
  Filled 2017-05-14: qty 1000

## 2017-05-14 MED ORDER — OXYCODONE-ACETAMINOPHEN 5-325 MG PO TABS: 1.0000 | ORAL_TABLET | ORAL | Status: DC | PRN

## 2017-05-14 MED ORDER — SIMETHICONE 80 MG PO CHEW: 80.0000 mg | CHEWABLE_TABLET | ORAL | Status: DC | PRN

## 2017-05-14 MED ORDER — LIDOCAINE HCL (PF) 1 % IJ SOLN
INTRAMUSCULAR | Status: AC
  Administered 2017-05-14: 30 mL
  Filled 2017-05-14: qty 30

## 2017-05-14 MED ORDER — ACETAMINOPHEN 325 MG PO TABS: 650.0000 mg | ORAL_TABLET | ORAL | Status: DC | PRN

## 2017-05-14 MED ORDER — DIBUCAINE 1 % RE OINT: 1.0000 "application " | TOPICAL_OINTMENT | RECTAL | Status: DC | PRN

## 2017-05-14 MED ORDER — ONDANSETRON HCL 4 MG/2ML IJ SOLN: 4.0000 mg | INTRAMUSCULAR | Status: DC | PRN

## 2017-05-14 MED ORDER — FENTANYL CITRATE (PF) 100 MCG/2ML IJ SOLN: 50.0000 ug | INTRAMUSCULAR | Status: DC | PRN

## 2017-05-14 NOTE — Anesthesia Pain Management Evaluation Note (Signed)
  CRNA Pain Management Visit Note  Patient: Monica MouseLaura C Calleros, 32 y.o., female  "Hello I am a member of the anesthesia team at [facility]. We have an anesthesia team available at all times to provide care throughout the hospital, including epidural management and anesthesia for C-section. I don't know your plan for the delivery whether it a natural birth, water birth, IV sedation, nitrous supplementation, doula or epidural, but we want to meet your pain goals."   1.Was your pain managed to your expectations on prior hospitalizations?   Yes   2.What is your expectation for pain management during this hospitalization?     Labor support without medications  3.How can we help you reach that goal?   Record the patient's initial score and the patient's pain goal.   Pain: 2  Pain Goal: 10 [facility] wants you to be able to say your pain was always managed very well.  Laban EmperorMalinova,Yuri Flener Hristova 05/14/2017

## 2017-05-14 NOTE — H&P (Signed)
Monica Cooke is a 32 y.o. female presenting for IOL for GBS pos and history of precipitous labor. OB History    Gravida Para Term Preterm AB Living   2 1 1     1    SAB TAB Ectopic Multiple Live Births           1     Past Medical History:  Diagnosis Date  . Medical history non-contributory   . No pertinent past medical history    Past Surgical History:  Procedure Laterality Date  . RHINOPLASTY     Family History: family history includes Diabetes in her father; Hyperlipidemia in her father and mother; Hypertension in her father and mother; Thyroid disease in her mother. Social History:  reports that  has never smoked. she has never used smokeless tobacco. She reports that she drinks about 0.6 - 1.2 oz of alcohol per week. She reports that she does not use drugs.     Maternal Diabetes: No Genetic Screening: Normal Maternal Ultrasounds/Referrals: Normal Fetal Ultrasounds or other Referrals:  None Maternal Substance Abuse:  No Significant Maternal Medications:  None Significant Maternal Lab Results:  Lab values include: Group B Strep positive Other Comments:  None  Review of Systems  Constitutional: Negative.   All other systems reviewed and are negative.  Maternal Medical History:  Reason for admission: Contractions.   Contractions: Onset was less than 1 hour ago.   Frequency: rare.   Perceived severity is mild.    Fetal activity: Perceived fetal activity is normal.   Last perceived fetal movement was within the past hour.    Prenatal complications: no prenatal complications Prenatal Complications - Diabetes: none.      Height 5\' 3"  (1.6 m), weight 76.5 kg (168 lb 11.2 oz), unknown if currently breastfeeding. Maternal Exam:  Uterine Assessment: Contraction strength is mild.  Abdomen: Patient reports no abdominal tenderness. Fetal presentation: vertex  Introitus: Normal vulva. Normal vagina.  Ferning test: not done.  Nitrazine test: not done. Amniotic fluid  character: not assessed.  Pelvis: adequate for delivery.   Cervix: Cervix evaluated by digital exam.     Physical Exam  Nursing note and vitals reviewed. Constitutional: She is oriented to person, place, and time. She appears well-developed.  HENT:  Head: Normocephalic and atraumatic.  Neck: Normal range of motion. Neck supple.  Cardiovascular: Normal rate and regular rhythm.  Respiratory: Effort normal and breath sounds normal.  GI: Soft. Bowel sounds are normal.  Genitourinary: Vagina normal and uterus normal.  Musculoskeletal: Normal range of motion.  Neurological: She is alert and oriented to person, place, and time. She has normal reflexes.  Skin: Skin is warm and dry.  Psychiatric: She has a normal mood and affect.    Prenatal labs: ABO, Rh: O/Positive/-- (06/07 0000) Antibody: Negative (06/07 0000) Rubella: Immune (06/07 0000) RPR: Nonreactive (06/07 0000)  HBsAg: Negative (06/07 0000)  HIV: Non-reactive (06/07 0000)  GBS: Positive (12/13 0000)   Assessment/Plan: 40 week IUP GBS POS Favorable cervix Geographic concerns IOL   Khamya Topp J 05/14/2017, 8:14 AM

## 2017-05-14 NOTE — Progress Notes (Signed)
Pt has voided twice since delivery. Informed pt that she can void on her own without assistance if she feels comfortable, if not she call for assistance. Pt sates that she feels comfortable voiding on her own without assistance.

## 2017-05-14 NOTE — Progress Notes (Signed)
Georges MouseLaura C Whicker is a 32 y.o. G2P1001 at 4712w0d by LMP admitted for induction of labor due to gBS pos and geographic concerns.  Subjective: Feels contractions  Objective: BP 126/73   Pulse 64   Temp 98.5 F (36.9 C) (Oral)   Resp 17   Ht 5\' 3"  (1.6 m)   Wt 76.5 kg (168 lb 11.2 oz)   BMI 29.88 kg/m  No intake/output data recorded. No intake/output data recorded.  FHT:  FHR: 145 bpm, variability: moderate,  accelerations:  Present,  decelerations:  Absent UC:   irregular, every 3-6 minutes SVE:   2-3/60/-1 AROM- clear  Labs: Lab Results  Component Value Date   WBC 8.1 05/14/2017   HGB 13.4 05/14/2017   HCT 37.9 05/14/2017   MCV 91.1 05/14/2017   PLT 182 05/14/2017    Assessment / Plan: Induction of labor due to above ,  progressing well on pitocin  Labor: Progressing normally Preeclampsia:  intake and ouput balanced and labs stable Fetal Wellbeing:  Category I Pain Control:  Labor support without medications I/D:  n/a Anticipated MOD:  NSVD  Daralyn Bert J 05/14/2017, 12:58 PM

## 2017-05-15 LAB — CBC
HCT: 36 % (ref 36.0–46.0)
HEMOGLOBIN: 12.5 g/dL (ref 12.0–15.0)
MCH: 32.1 pg (ref 26.0–34.0)
MCHC: 34.7 g/dL (ref 30.0–36.0)
MCV: 92.3 fL (ref 78.0–100.0)
PLATELETS: 166 10*3/uL (ref 150–400)
RBC: 3.9 MIL/uL (ref 3.87–5.11)
RDW: 13.4 % (ref 11.5–15.5)
WBC: 13.5 10*3/uL — AB (ref 4.0–10.5)

## 2017-05-15 NOTE — Progress Notes (Signed)
PPD # 1 SVD Information for the patient's newborn:  Monica Cooke, Girl [name] [[id number]][[id number]]  female      breast feeding  Baby name: [name]  S:  Reports feeling well.             Tolerating po/ No nausea or vomiting             Bleeding is light             Pain controlled with ibuprofen (OTC)             Up ad lib / ambulatory / voiding without difficulties        O:  A & O x 3, in no apparent distress              VS:  Vitals:   05/14/17 1754 05/14/17 1853 05/14/17 2147 05/15/17 0533  BP: 107/61 (!) 106/55 112/61 107/62  Pulse: 67 75 72 77  Resp: 18 18 18 18   Temp: 98.7 F (37.1 C) 98.9 F (37.2 C) 98.5 F (36.9 C) 98 F (36.7 C)  TempSrc: Oral Oral Oral Oral  Weight:      Height:        LABS:  Recent Labs    05/14/17 0806 05/15/17 0544  WBC 8.1 13.5*  HGB 13.4 12.5  HCT 37.9 36.0  PLT 182 166    Blood type: --/--/O POS (01/11 [id number])  Rubella: Immune (06/07 0000)   I&O: I/O last 3 completed shifts: In: -  Out: 100 [Blood:100]          No intake/output data recorded.  Lungs: Clear and unlabored  Heart: regular rate and rhythm / no murmurs  Abdomen: soft, non-tender, non-distended             Fundus: firm, non-tender, U-1  Perineum: intact  Lochia: small  Extremities: no edema, no calf pain or tenderness    A/P: PPD # 1 31 y.o., [id number]   Principal Problem:   NSVD (normal spontaneous vaginal delivery) 1/11 Active Problems:   Postpartum care following vaginal delivery   Doing well - stable status  Routine post partum orders  Anticipate discharge tomorrow    Neta Mendsaniela C Sharai Overbay, MSN, CNM 05/15/2017, 9:39 AM

## 2017-05-15 NOTE — Lactation Note (Signed)
This note was copied from a baby's chart. Lactation Consultation Note Baby 13 hrs old. Mom BF to Lt. Breast in front sitting up position when LC entered rm. Mom stated that she has very sensitive and was unable to BF very long with her first child. Stated she has a lot of trouble w/painful BF.  Assessed moms breast. Has everted nipples. Slightly red at base and sides of nipple. Blisters to tip of nipples. Mom asked for a NS to see if that would help w/pain.  Fitted mom w/#24 NS, slightly big, room on the sides, #20 fit snug. Latched baby w/#20 NS. Mom demonstrated application of NS. Assisted in football position. Latched well. Mom stated a little irritation to bottom of nipple. Chin tug to jaw. Mom stated much better. Mom stated it felt better to BF w/NS. Stressed importance of assessing breast before and after Bf for transfer.  Baby BF until arm relaxed. No transfer noted in NS. Just moisture from baby. Asked mom to use #24 NS for next feeding and look for transfer.  Coconut oil applied to nipples. Shells given to wear to prevent soreness of blistered nipples rubbing on bra.  Hand pump given if needed.  Discussed newborn feeding behavior, STS, I&O, cluster feeding, supply and demand.  Mom encouraged to feed baby 8-12 times/24 hours and with feeding cues.  Encouraged to call for assistance w/#24 NS.  WH/LC brochure given w/resources, support groups and LC services. Patient Name: [name] GMWNU'UToday's Date: 05/15/2017 Reason for consult: Initial assessment   Maternal Data Has patient been taught Hand Expression?: Yes Does the patient have breastfeeding experience prior to this delivery?: Yes  Feeding Feeding Type: Breast Fed Length of feed: 30 min  LATCH Score Latch: Grasps breast easily, tongue down, lips flanged, rhythmical sucking.  Audible Swallowing: None  Type of Nipple: Everted at rest and after stimulation  Comfort (Breast/Nipple): Filling, red/small blisters or bruises,  mild/mod discomfort  Hold (Positioning): Assistance needed to correctly position infant at breast and maintain latch.  LATCH Score: 6  Interventions Interventions: Breast feeding basics reviewed;Coconut oil;Assisted with latch;Breast compression;Shells;Skin to skin;Adjust position;Breast massage;Support pillows;Hand pump;Hand express;Position options  Lactation Tools Discussed/Used Tools: Shells;Pump;Coconut oil;Nipple Shields Nipple shield size: 20;24 Shell Type: Inverted Breast pump type: Manual WIC Program: No Pump Review: Setup, frequency, and cleaning;Milk Storage Initiated by:: Peri JeffersonL. Kathya Wilz RN IBCLC Date initiated:: 05/15/17   Consult Status Consult Status: Follow-up Date: 05/15/17 Follow-up type: In-patient    Monica Cooke, Diamond NickelLAURA G 05/15/2017, 5:59 AM

## 2017-05-16 MED ORDER — ACETAMINOPHEN 325 MG PO TABS: 650.0000 mg | ORAL_TABLET | ORAL | Status: DC | PRN

## 2017-05-16 MED ORDER — IBUPROFEN 600 MG PO TABS: 600.0000 mg | ORAL_TABLET | Freq: Four times a day (QID) | ORAL | 0 refills | Status: DC

## 2017-05-16 MED ORDER — BENZOCAINE-MENTHOL 20-0.5 % EX AERO: 1.0000 "application " | INHALATION_SPRAY | CUTANEOUS | Status: DC | PRN

## 2017-05-16 MED ORDER — COCONUT OIL OIL: 1.0000 "application " | TOPICAL_OIL | 0 refills | Status: DC | PRN

## 2017-05-16 NOTE — Lactation Note (Addendum)
This note was copied from a baby's chart. Lactation Consultation Note  Patient Name: Monica Paralee CancelLaura Rasch ZOXWR'UToday's Date: 05/16/2017 Reason for consult: Follow-up assessment;Nipple pain/trauma;Term  Mom called for assistance with positioning and latching.  Baby 44 hrs old, at 8% weight loss, and Mom has had painful latching.    Baby noted to have a tongue that elevates on the side, posterior short frenulum noted which restricts center of tongue  Lingual frenulum noted as well.  On digital suck assessment, baby humps back of tongue and tends to push finger out of her mouth.  Showed Mom how to do suck training with her finger to help keep tongue down.  Baby assisted with football hold.  Mom using good technique and assisted with sandwiching breast to facilitate a deep latch.  Baby appeared to latch deeply with nutritive suck pattern noted.  Mom complaining of discomfort.  Some existing slight bruising and blistering noted on nipple tips.  Took baby off and nipple creased along both sides of nipple shaft.  Initiated a 24 mm nipple shield.  Reviewed how to apply correctly, and clean.  Nipple pulled well into shield.  Baby latched, un tucked lower lip with chin tug.  Baby became more nutritive, and multiple, regular swallowing identified.  Mom taught how to do alternate breast compression to increase milk transfer.    Mom does not have a pump at home.  Rental program explained and recommended.  Set up DEBP at bedside, and recommended she pump after breastfeeding (4-8 times a day), and offer EBM back to baby.  To let her RN know to help her with feeding colostrum to baby.  Demonstrated how to use pump parts as a double pump.    Recommended follow up with Lactation, message sent to clinic.  Mom unsure of her ability to latch and breastfeed.    Recommended a weight check with Pediatrician tomorrow.    Feeding Feeding Type: Breast Fed Length of feed: 20 min  LATCH Score Latch: Grasps breast easily, tongue  down, lips flanged, rhythmical sucking.  Audible Swallowing: Spontaneous and intermittent  Type of Nipple: Everted at rest and after stimulation  Comfort (Breast/Nipple): Filling, red/small blisters or bruises, mild/mod discomfort  Hold (Positioning): Assistance needed to correctly position infant at breast and maintain latch.  LATCH Score: 8  Interventions Interventions: Breast feeding basics reviewed;Assisted with latch;Skin to skin;Breast massage;Hand express;Pre-pump if needed;Breast compression;Adjust position;Support pillows;Position options;Expressed milk;Coconut oil;Shells;Hand pump;DEBP  Lactation Tools Discussed/Used Tools: Shells;Nipple Shields Nipple shield size: 24 Shell Type: Inverted Breast pump type: Double-Electric Breast Pump   Consult Status Consult Status: Follow-up Date: 05/16/17 Follow-up type: Out-patient    Judee ClaraSmith, Nahiara Kretzschmar E 05/16/2017, 12:25 PM

## 2017-05-16 NOTE — Lactation Note (Signed)
This note was copied from a baby's chart. Lactation Consultation Note  Patient Name: Monica Cooke ZOXWR'UToday's Date: 05/16/2017   Visited with P2 Mom, baby 42 hrs.  Mom complaining of soreness, and nipple being flattened post feeding.  Baby at 8% weight loss today.    Baby cluster fed this am, and asleep in Mom's arms.  Recommend that she unwrap baby and place her STS.  Offered to assist and assess at next feeding.  Mom to call when baby is ready to feed.   Judee ClaraSmith, [place] Seaborn E 05/16/2017, 10:12 AM

## 2017-05-16 NOTE — Discharge Summary (Signed)
Obstetric Discharge Summary Reason for Admission: induction of labor Prenatal Procedures: ultrasound Intrapartum Procedures: spontaneous vaginal delivery and GBS prophylaxis Postpartum Procedures: none Complications-Operative and Postpartum: none Hemoglobin  Date Value Ref Range Status  05/15/2017 12.5 12.0 - 15.0 g/dL Final   HCT  Date Value Ref Range Status  05/15/2017 36.0 36.0 - 46.0 % Final    Physical Exam:  General: alert, cooperative and no distress Lochia: appropriate Uterine Fundus: firm Incision: NA DVT Evaluation: No cords or calf tenderness. No significant calf/ankle edema.  Discharge Diagnoses: Term Pregnancy-delivered  Discharge Information: Date: 05/16/2017 Activity: pelvic rest Diet: routine Medications:  Allergies as of 05/16/2017      Reactions   Fiber [psyllium] Shortness Of Breath   All fiber supplements      Medication List    TAKE these medications   acetaminophen 325 MG tablet Commonly known as:  TYLENOL Take 2 tablets (650 mg total) by mouth every 4 (four) hours as needed (for pain scale < 4).   benzocaine-Menthol 20-0.5 % Aero Commonly known as:  DERMOPLAST Apply 1 application topically as needed for irritation (perineal discomfort).   calcium carbonate 500 MG chewable tablet Commonly known as:  TUMS - dosed in mg elemental calcium Chew 2 tablets by mouth 2 (two) times daily as needed for indigestion or heartburn.   coconut oil Oil Apply 1 application topically as needed.   ibuprofen 600 MG tablet Commonly known as:  ADVIL,MOTRIN Take 1 tablet (600 mg total) by mouth every 6 (six) hours.   prenatal multivitamin Tabs tablet Take 1 tablet by mouth at bedtime.   sodium chloride 0.65 % Soln nasal spray Commonly known as:  OCEAN Place 1 spray into both nostrils as needed for congestion.            Discharge Care Instructions  (From admission, onward)        Start     Ordered   05/16/17 0000  Discharge wound care:     Comments:  Sitz baths 2 times /day with warm water x 1 week   05/16/17 1000     Condition: stable Instructions: refer to practice specific booklet Discharge to: home Follow-up Information    Olivia Mackieaavon, Richard, MD. Schedule an appointment as soon as possible for a visit in 6 week(s).   Specialty:  Obstetrics and Gynecology Contact information: [address] [place] [address] [phone]           Newborn Data: Live born female [name] Birth Weight: 7 lb 4.1 oz (3291 g) APGAR: 8, 9  Newborn Delivery   Birth date/time:  05/14/2017 16:07:00 Delivery type:  Vaginal, Spontaneous     Home with mother.  Neta MendsDaniela C Paul, CNM 05/16/2017, 10:01 AM

## 2017-10-21 ENCOUNTER — Ambulatory Visit (INDEPENDENT_AMBULATORY_CARE_PROVIDER_SITE_OTHER): Payer: Managed Care, Other (non HMO) | Admitting: Internal Medicine

## 2017-10-21 ENCOUNTER — Encounter: Payer: Self-pay | Admitting: Internal Medicine

## 2017-10-21 VITALS — BP 100/68 | HR 84 | Ht 63.58 in | Wt 166.0 lb

## 2017-10-21 DIAGNOSIS — E041 Nontoxic single thyroid nodule: Secondary | ICD-10-CM

## 2017-10-21 NOTE — Progress Notes (Signed)
Patient ID: Monica MouseLaura C Cooke, female   DOB: [birth year], 32 y.o.   MRN: [id number]    HPI  Monica Cooke is a 32 y.o.-year-old female, initially referred by Dr. Billy Cooke, returning for follow-up for thyroid nodules.  Last visit 1 year and 8 months ago.  She gave birth in 05/2017.  At the end of last mo >> pain in neck, swelling (cyst increased in size). She saw PCP >> TFTs normal.  New thyroid  U/S: isthmic cyst increased to 6.5 x 3.3 cm.   Reviewed and addended history: She had a thyroid nodule noticed that the physical exam in 10/2014 by PCP.   [facility]: 3.8 x 2.1 cm L-isthmic nodule.   S he was sent to ENT, but did not go as she lost her insurance.  She then had insurance again and saw Dr. Jorja Cooke, who also felt the thyroid nodule and referred her to me.  We checked a thyroid ultrasound  06/26/2016 thyroid ultrasound: Small thyroid nodules, not worrisome, except for a larger, 4.8 cm isthmic nodule.  Pt denies: - hoarseness - dysphagia - choking - SOB with lying down However, she had neck pressure and pain 3 weeks ago >> improved  I reviewed pt's thyroid tests: 09/30/2017: TSH 0.545, fT4 0.9, fT3 3.12 Lab Results  Component Value Date   TSH 0.70 05/25/2016   TSH 0.27 (L) 02/24/2016   FREET4 0.74 05/25/2016   FREET4 0.88 02/24/2016   T3FREE 2.6 05/25/2016   T3FREE 3.2 02/24/2016  10/16/2014: TSH 1.35, free T4 1.08, free T3 3.26  No results found for: TSI   Pt denies: - weight loss, beyond what is expected after pregnancy - heat intolerance - tremors - palpitations - anxiety - hyperdefecation - hair loss  No FH of thyroid ds. No FH of thyroid cancer. No h/o radiation tx to head or neck.  No seaweed or kelp. No recent contrast studies. No herbal supplements. No Biotin use. No recent steroids use.   Pt also has a history of mother - goiter.  She exercises 2-3 times a week: Walking, Zumba, Pilates.   ROS: Constitutional: no weight gain/no  weight loss, no fatigue, no subjective hyperthermia, no subjective hypothermia Eyes: no blurry vision, no xerophthalmia ENT: no sore throat, + see HPI Cardiovascular: no CP/no SOB/no palpitations/no leg swelling Respiratory: no cough/no SOB/no wheezing Gastrointestinal: no N/no V/no D/no C/no acid reflux Musculoskeletal: no muscle aches/no joint aches Skin: no rashes, + hair loss Neurological: no tremors/no numbness/no tingling/no dizziness  I reviewed pt's medications, allergies, PMH, social hx, family hx, and changes were documented in the history of present illness. Otherwise, unchanged from my initial visit note.  Past Medical History:  Diagnosis Date  . Medical history non-contributory   . No pertinent past medical history    Past Surgical History:  Procedure Laterality Date  . RHINOPLASTY     Social History   Social History  . Marital status: Married    Spouse name: N/A  . Number of children: 1   Occupational History  . LPN   Social History Main Topics  . Smoking status: Never Smoker  . Smokeless tobacco: No  . Alcohol use Drinks 1-2 glasses of wine occasionally   . Drug use: No   Current Outpatient Prescriptions on File Prior to Visit  Medication Sig Dispense Refill  . ibuprofen (ADVIL,MOTRIN) 600 MG tablet Take 0.5 tablets (300 mg total) by mouth every 6 (six) hours as needed for pain. 30  tablet    Allergies  Allergen Reactions  . Fiber [Psyllium] Shortness Of Breath    All fiber supplements   Family History  Problem Relation Age of Onset  . Thyroid disease Mother   . Hyperlipidemia Mother   . Hypertension Mother   . Hyperlipidemia Father   . Hypertension Father   . Diabetes Father   . Other Neg Hx     PE: BP 100/68   Pulse 84   Ht 5' 3.58" (1.615 m)   Wt 166 lb (75.3 kg)   SpO2 98%   Breastfeeding? Yes   BMI 28.87 kg/m  Wt Readings from Last 3 Encounters:  10/21/17 166 lb (75.3 kg)  05/14/17 168 lb 11.2 oz (76.5 kg)  02/24/16 145 lb (65.8  kg)   Constitutional: overweight, in NAD Eyes: PERRLA, EOMI, no exophthalmos ENT: moist mucous membranes, +  large mobile nodule noticed with swallowing and on palpation: Central low cervical region with extension to the left, no cervical lymphadenopathy Cardiovascular: RRR, No MRG Respiratory: CTA B Gastrointestinal: abdomen soft, NT, ND, BS+ Musculoskeletal: no deformities, strength intact in all 4 Skin: moist, warm, no rashes Neurological: no tremor with outstretched hands, DTR normal in all 4  ASSESSMENT: 1. Thyroid nodules  2.  Subclinical thyrotoxicosis  PLAN: 1.  Patient with a history of a large isthmic thyroid nodule (cyst) along with other smaller nodules seen on the thyroid ultrasound from 06/2016.  The nodules did not meet indication for biopsy or follow-up: "Multiple cystic/ colloid nodules, with no nodule meeting criteria for biopsy or surveillance, as designated by the newly established ACR TI-RADS criteria".  She had a recent ultrasound, but the report is not in her chart and I reviewed this on her phone.  She will fax me the report by tomorrow. - she had neck compression symptoms - pressure and pain approximately 3 weeks ago, now improved.  However, she still has enlargement of her anterior neck - No family history of thyroid cancer and no personal history of radiation therapy to head or neck, which would favor benignity.  I explained that we are not concerned about the cancerous nature of that cyst, but this causes her problems in terms of neck compression symptoms so we will need to drain it.  She agrees.  We will drain it without alcohol installation for now, but I explained that if it recurs, we may need to d drain it again and instill alcohol to hopefully keep the cyst collapsed and prevent it from recurring. -Today I ordered: Orders Placed This Encounter  Procedures  . [place] FNA SOFT TISSUE  - Reviewed her most recent TFTs, which were normal - RTC in 1 year, but I  advised her to let me know if she continues to experience neck compression symptoms.  2.  Subclinical thyrotoxicosis - Review TFTs from 05/2016 and, more recently, in 09/2017, normalized.  Previously, in 02/2016, TSH was slightly low, with normal free hormones - I did order a thyroid uptake and scan at last visit, but she did not have this checked.  I do not think this is necessary now, with normal TFTs.  Carlus Pavlov, MD PhD [facility]

## 2017-10-21 NOTE — Patient Instructions (Signed)
Please stop in [facility] downstairs.  Please come back for a follow-up appointment in 1 year.

## 2017-10-28 ENCOUNTER — Telehealth: Payer: Self-pay | Admitting: Internal Medicine

## 2017-10-28 NOTE — Telephone Encounter (Signed)
Keila from GI calling needing to verify a order for a biospy for this patient, what she wanted, and where it needed to be done. Please advise [phone]

## 2017-10-29 NOTE — Telephone Encounter (Signed)
Noted  

## 2017-10-29 NOTE — Telephone Encounter (Signed)
I d/w Dr. Grace IsaacWatts.

## 2017-10-29 NOTE — Telephone Encounter (Signed)
Please advise on below  

## 2018-05-03 DIAGNOSIS — D225 Melanocytic nevi of trunk: Secondary | ICD-10-CM | POA: Diagnosis not present

## 2018-05-03 DIAGNOSIS — D485 Neoplasm of uncertain behavior of skin: Secondary | ICD-10-CM | POA: Diagnosis not present

## 2018-10-10 IMAGING — US US SOFT TISSUE HEAD/NECK
1 series · 12 of 25 positions shown · non-contrast
Comparison: 10/26/2014

CLINICAL DATA: 30-year-old female with a history of thyroid nodule

EXAM:
THYROID ULTRASOUND
TECHNIQUE: Ultrasound examination of the thyroid gland and adjacent soft
tissues was performed.

[Series 1: us soft tissue head/neck · 0.07mm/px · 12 of 64 slices shown]
[im 3/64]
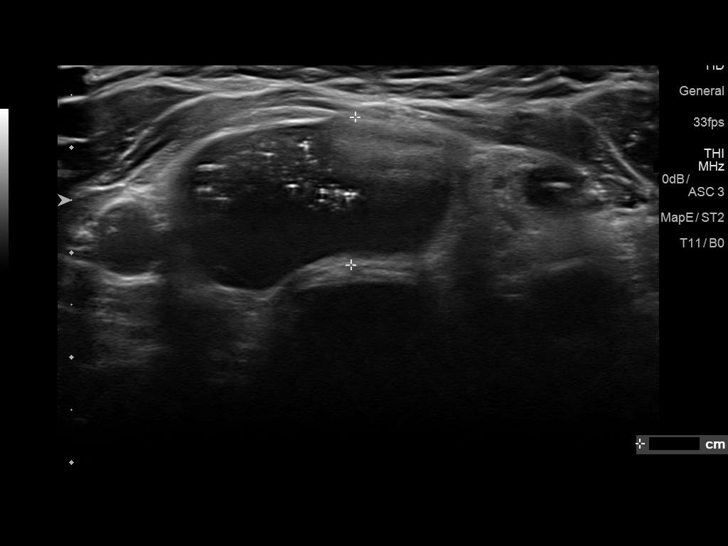
[im 8/64]
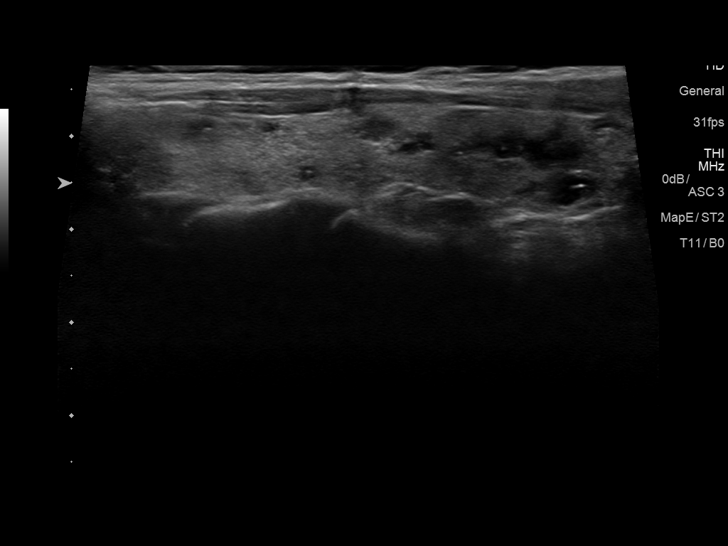
[im 14/64]
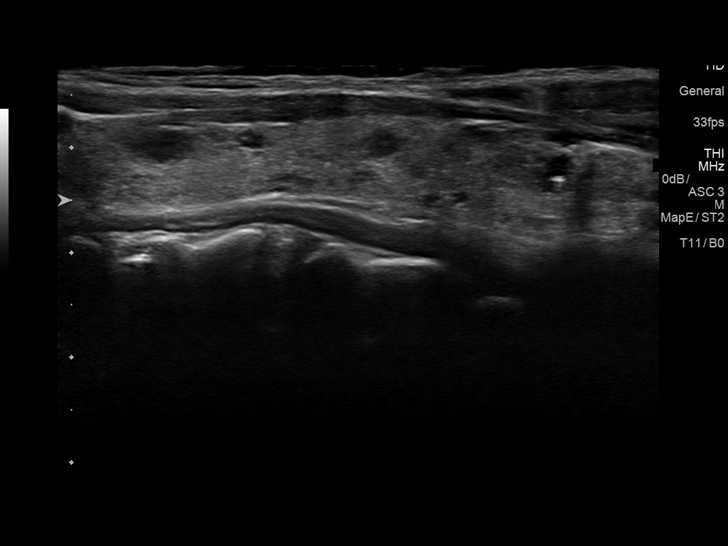
[im 19/64]
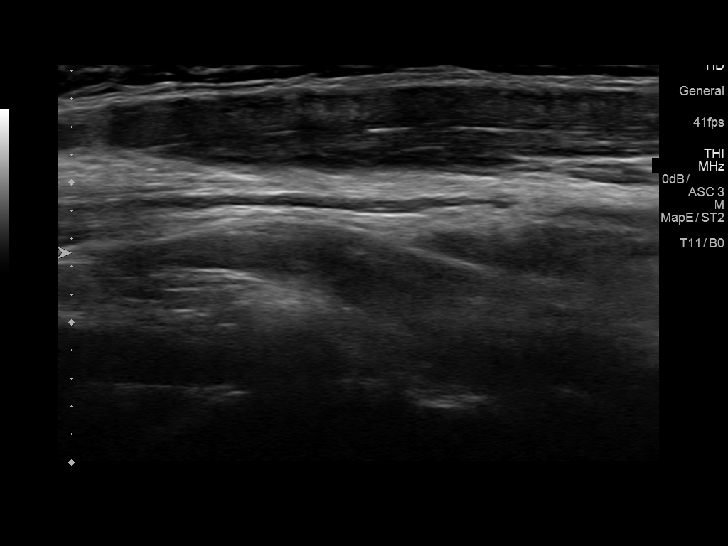
[im 24/64]
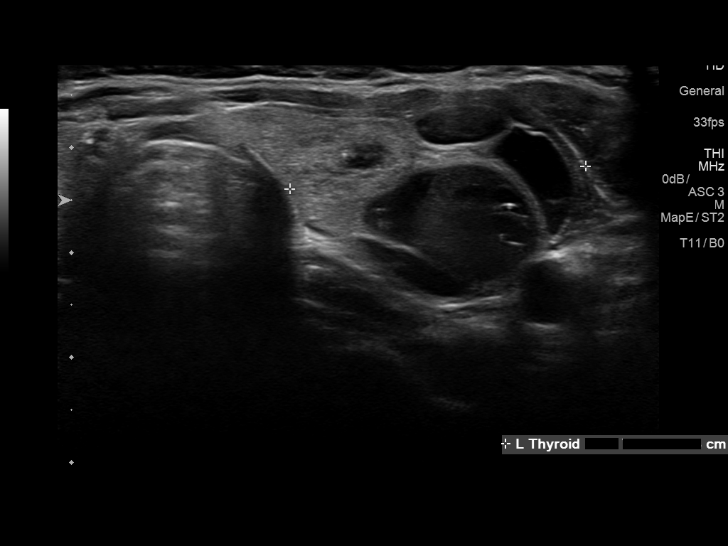
[im 29/64]
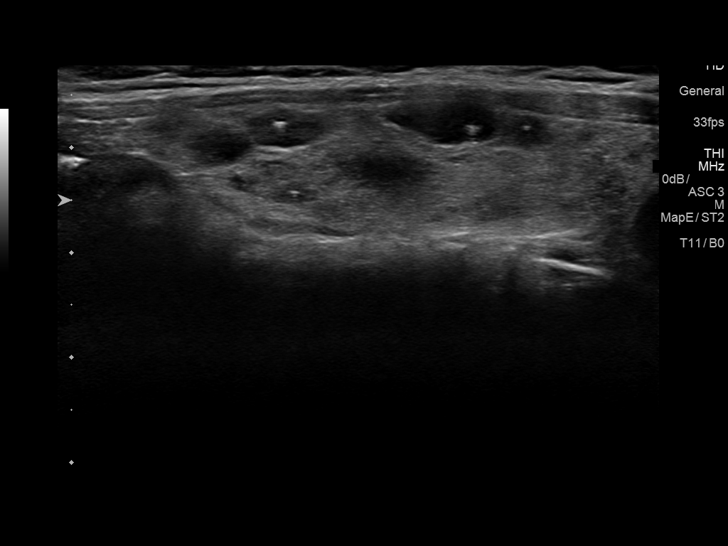
[im 35/64]
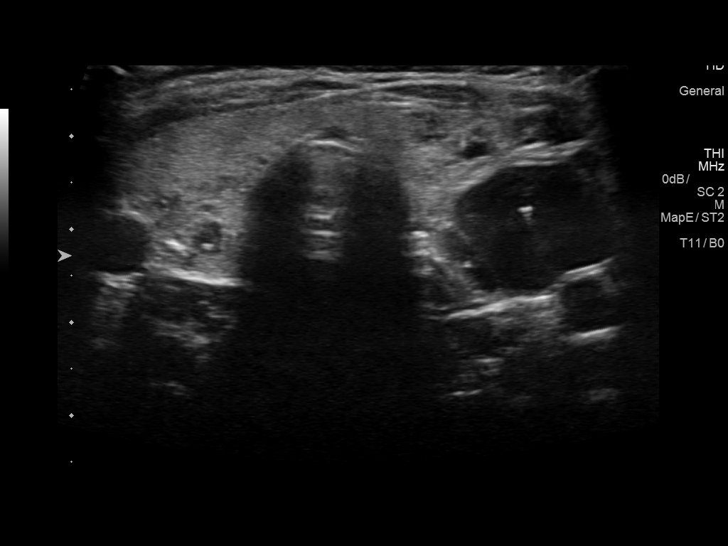
[im 40/64]
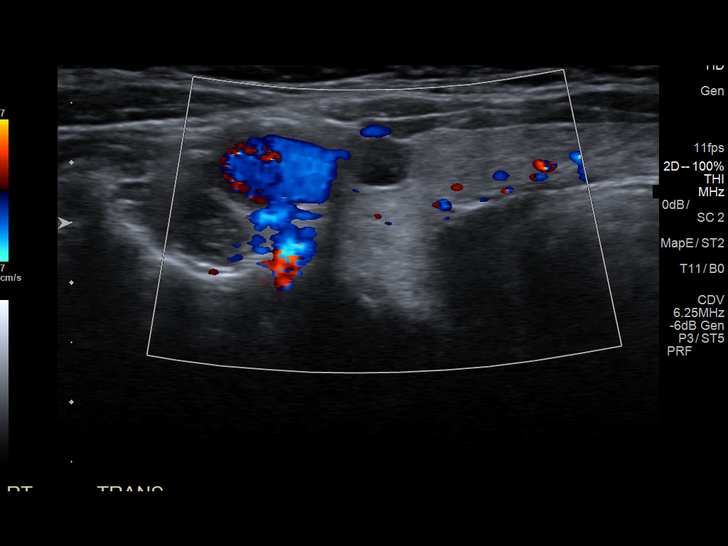
[im 45/64]
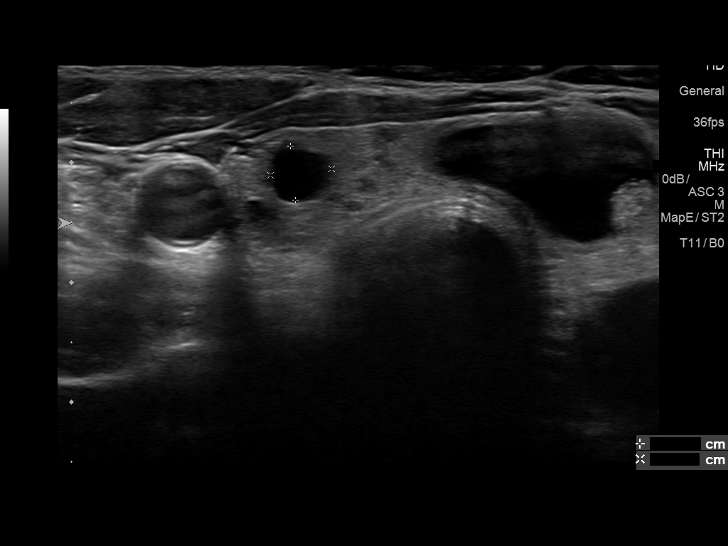
[im 50/64]
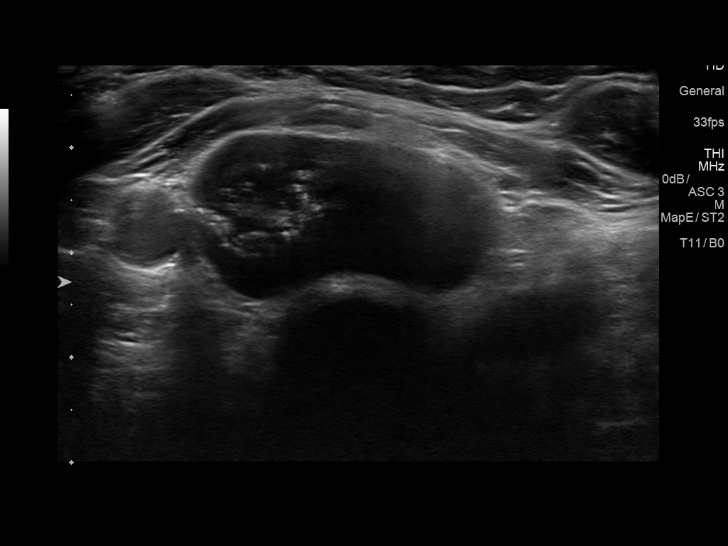
[im 56/64]
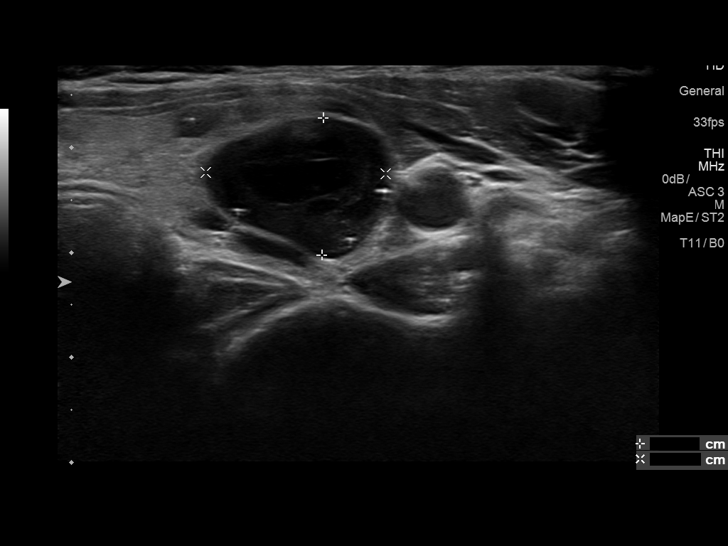
[im 61/64]
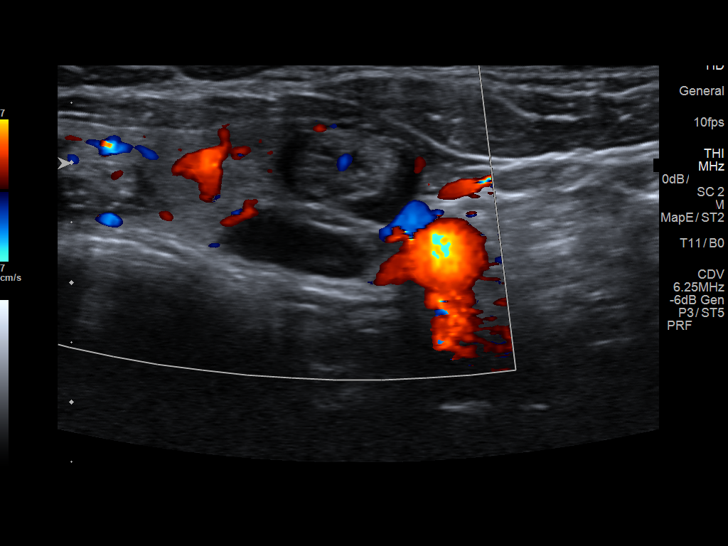

[12 of 25 positions shown; findings below may reference images not displayed]

FINDINGS: Parenchymal Echotexture: Mildly heterogenous

Isthmus: 0.5 cm

Right lobe: 6.1 cm x 1.0 cm x 1.6 cm

Left lobe: 6.1 cm x 1.8 cm x 2.8 cm

_________________________________________________________

Estimated total number of nodules >/= 1 cm: 3

Number of spongiform nodules >/=  2 cm not described below (TR1): 0

Number of mixed cystic and solid nodules >/= 1.5 cm not described
below (TR2): 0

Nodule # 1:

Location: Isthmus; Mid

Maximum size: 4.8 cm; Other 2 dimensions: 1.5 cm x 2.8 cm

Composition: cystic/almost completely cystic (0)

Echogenicity: isoechoic (1)

Shape: not taller-than-wide (0)

Margins: smooth (0)

Echogenic foci: none (0)

ACR TI-RADS total points: 1.

ACR TI-RADS risk category: TR1 (0-1 points).

ACR TI-RADS recommendations:

Colloid cyst/nodule does not meet criteria for biopsy or
surveillance.

Nodule # 1:

Location: Right; Superior

Maximum size: 0.9 cm; Other 2 dimensions: 0.4 cm x 0.5 cm

Composition: cystic/almost completely cystic (0)

Echogenicity: anechoic (0)

Shape: not taller-than-wide (0)

Margins: smooth (0)

Echogenic foci: none (0)

ACR TI-RADS total points: 0.

ACR TI-RADS risk category: TR1 (0-1 points).

ACR TI-RADS recommendations:

Colloid cyst/nodule does not meet criteria for biopsy or
surveillance

Nodule # 2:

Location: Right; Mid

Maximum size: 0.5 cm; Other 2 dimensions: 0.3 cm x 0.4 cm

Composition: cystic/almost completely cystic (0)

Echogenicity: anechoic (0)

Shape: not taller-than-wide (0)

Margins: smooth (0)

Echogenic foci: none (0)

ACR TI-RADS total points: 0.

ACR TI-RADS risk category: TR1 (0-1 points).

ACR TI-RADS recommendations:

Cystic nodule does not meet criteria for surveillance or biopsy

Nodule # 3:

Location: Right; Inferior

Maximum size: 0.7 cm; Other 2 dimensions: 0.5 cm x 0.5 cm

Composition: cystic/almost completely cystic (0)

Echogenicity: anechoic (0)

Shape: not taller-than-wide (0)

Margins: smooth (0)

Echogenic foci: none (0)

ACR TI-RADS total points: 0.

ACR TI-RADS risk category: TR1 (0-1 points).

ACR TI-RADS recommendations:

Cystic/colloid nodule does not meet criteria for surveillance or
biopsy

Nodule # 1:

Location: Left; Mid

Maximum size: 2.4 cm; Other 2 dimensions: 1.3 cm x 1.7 cm

Composition: cystic/almost completely cystic (0)

Echogenicity: anechoic (0)

Shape: not taller-than-wide (0)

Margins: smooth (0)

Echogenic foci: none (0)

ACR TI-RADS total points: 0.

ACR TI-RADS risk category: TR1 (0-1 points).

ACR TI-RADS recommendations:

Cystic/colloid nodule does not meet criteria for surveillance or
biopsy

Nodule # 2:

Location: Left; Inferior

Maximum size: 1.3 cm; Other 2 dimensions: 0.8 cm x 1.3 cm

Composition: mixed cystic and solid (1)

Echogenicity: isoechoic (1)

Shape: not taller-than-wide (0)

Margins: smooth (0)

Echogenic foci: none (0)

ACR TI-RADS total points: 2.

ACR TI-RADS risk category: TR2 (2 points).

ACR TI-RADS recommendations:

Cystic nodule does not meet criteria for surveillance or biopsy
IMPRESSION: Multiple cystic/ colloid nodules, with no nodule meeting criteria
for biopsy or surveillance, as designated by the newly established
ACR TI-RADS criteria.

Recommendations follow those established by the new ACR TI-RADS
criteria ([HOSPITAL] 9929;[DATE]).

## 2019-05-05 NOTE — L&D Delivery Note (Signed)
Delivery Note At 11:22 AM a viable and healthy female was delivered via Vaginal, Spontaneous (Presentation:   Occiput Anterior).  APGAR: 6, 9; weight pending  .   Placenta status: Spontaneous, Intact.  Cord: 3 vessels with the following complications: None.  Cord pH: na  Anesthesia: Local Episiotomy: None Lacerations: None Suture Repair: na Est. Blood Loss (mL): 100  Mom to postpartum.  Baby to Couplet care / Skin to Skin.  Storm Dulski J 12/18/2019, 11:49 AM

## 2019-05-31 LAB — OB RESULTS CONSOLE HEPATITIS B SURFACE ANTIGEN: Hepatitis B Surface Ag: NEGATIVE

## 2019-05-31 LAB — OB RESULTS CONSOLE GC/CHLAMYDIA
Chlamydia: NEGATIVE
Gonorrhea: NEGATIVE

## 2019-05-31 LAB — OB RESULTS CONSOLE RUBELLA ANTIBODY, IGM: Rubella: IMMUNE

## 2019-05-31 LAB — OB RESULTS CONSOLE ANTIBODY SCREEN: Antibody Screen: NEGATIVE

## 2019-05-31 LAB — OB RESULTS CONSOLE ABO/RH: RH Type: POSITIVE

## 2019-05-31 LAB — OB RESULTS CONSOLE RPR: RPR: NONREACTIVE

## 2019-05-31 LAB — OB RESULTS CONSOLE HIV ANTIBODY (ROUTINE TESTING): HIV: NONREACTIVE

## 2019-11-22 LAB — OB RESULTS CONSOLE GBS: GBS: NEGATIVE

## 2019-12-08 ENCOUNTER — Telehealth (HOSPITAL_COMMUNITY): Payer: Self-pay | Admitting: *Deleted

## 2019-12-08 ENCOUNTER — Encounter (HOSPITAL_COMMUNITY): Payer: Self-pay | Admitting: *Deleted

## 2019-12-08 NOTE — Telephone Encounter (Signed)
Preadmission screen  

## 2019-12-11 ENCOUNTER — Telehealth (HOSPITAL_COMMUNITY): Payer: Self-pay | Admitting: *Deleted

## 2019-12-11 ENCOUNTER — Encounter (HOSPITAL_COMMUNITY): Payer: Self-pay | Admitting: *Deleted

## 2019-12-11 NOTE — Telephone Encounter (Signed)
Preadmission screen  

## 2019-12-14 ENCOUNTER — Other Ambulatory Visit: Payer: Self-pay | Admitting: Obstetrics and Gynecology

## 2019-12-16 ENCOUNTER — Other Ambulatory Visit (HOSPITAL_COMMUNITY)
Admission: RE | Admit: 2019-12-16 | Discharge: 2019-12-16 | Disposition: A | Discharge status: Home / Self Care | Payer: No Typology Code available for payment source | Source: Ambulatory Visit | Attending: Obstetrics and Gynecology | Admitting: Obstetrics and Gynecology

## 2019-12-16 DIAGNOSIS — Z01812 Encounter for preprocedural laboratory examination: Secondary | ICD-10-CM | POA: Insufficient documentation

## 2019-12-16 DIAGNOSIS — Z20822 Contact with and (suspected) exposure to covid-19: Secondary | ICD-10-CM | POA: Insufficient documentation

## 2019-12-16 LAB — SARS CORONAVIRUS 2 (TAT 6-24 HRS): SARS Coronavirus 2: NEGATIVE

## 2019-12-18 ENCOUNTER — Encounter (HOSPITAL_COMMUNITY): Payer: Self-pay | Admitting: Obstetrics and Gynecology

## 2019-12-18 ENCOUNTER — Other Ambulatory Visit: Payer: Self-pay

## 2019-12-18 ENCOUNTER — Inpatient Hospital Stay (HOSPITAL_COMMUNITY): Payer: No Typology Code available for payment source

## 2019-12-18 ENCOUNTER — Inpatient Hospital Stay (HOSPITAL_COMMUNITY)
Admission: AD | Admit: 2019-12-18 | Discharge: 2019-12-19 | DRG: 807 | Disposition: A | Discharge status: Home / Self Care | Payer: No Typology Code available for payment source | Attending: Obstetrics and Gynecology | Admitting: Obstetrics and Gynecology

## 2019-12-18 DIAGNOSIS — Z20822 Contact with and (suspected) exposure to covid-19: Secondary | ICD-10-CM | POA: Diagnosis present

## 2019-12-18 DIAGNOSIS — Z3490 Encounter for supervision of normal pregnancy, unspecified, unspecified trimester: Secondary | ICD-10-CM | POA: Diagnosis present

## 2019-12-18 DIAGNOSIS — O480 Post-term pregnancy: Secondary | ICD-10-CM | POA: Diagnosis present

## 2019-12-18 DIAGNOSIS — Z3A40 40 weeks gestation of pregnancy: Secondary | ICD-10-CM

## 2019-12-18 LAB — TYPE AND SCREEN
ABO/RH(D): O POS
Antibody Screen: NEGATIVE

## 2019-12-18 LAB — CBC
HCT: 39.1 % (ref 36.0–46.0)
Hemoglobin: 12.7 g/dL (ref 12.0–15.0)
MCH: 29.3 pg (ref 26.0–34.0)
MCHC: 32.5 g/dL (ref 30.0–36.0)
MCV: 90.1 fL (ref 80.0–100.0)
Platelets: 180 10*3/uL (ref 150–400)
RBC: 4.34 MIL/uL (ref 3.87–5.11)
RDW: 13 % (ref 11.5–15.5)
WBC: 8.6 10*3/uL (ref 4.0–10.5)
nRBC: 0 % (ref 0.0–0.2)

## 2019-12-18 LAB — RPR: RPR Ser Ql: NONREACTIVE

## 2019-12-18 MED ORDER — TETANUS-DIPHTH-ACELL PERTUSSIS 5-2.5-18.5 LF-MCG/0.5 IM SUSP: 0.5000 mL | Freq: Once | INTRAMUSCULAR | Status: DC

## 2019-12-18 MED ORDER — LACTATED RINGERS IV SOLN: 500.0000 mL | Freq: Once | INTRAVENOUS | Status: DC

## 2019-12-18 MED ORDER — SOD CITRATE-CITRIC ACID 500-334 MG/5ML PO SOLN: 30.0000 mL | ORAL | Status: DC | PRN

## 2019-12-18 MED ORDER — WITCH HAZEL-GLYCERIN EX PADS: 1.0000 "application " | MEDICATED_PAD | CUTANEOUS | Status: DC | PRN

## 2019-12-18 MED ORDER — COCONUT OIL OIL: 1.0000 "application " | TOPICAL_OIL | Status: DC | PRN

## 2019-12-18 MED ORDER — ONDANSETRON HCL 4 MG/2ML IJ SOLN: 4.0000 mg | Freq: Four times a day (QID) | INTRAMUSCULAR | Status: DC | PRN

## 2019-12-18 MED ORDER — EPHEDRINE 5 MG/ML INJ: 10.0000 mg | INTRAVENOUS | Status: DC | PRN

## 2019-12-18 MED ORDER — FENTANYL-BUPIVACAINE-NACL 0.5-0.125-0.9 MG/250ML-% EP SOLN: 12.0000 mL/h | EPIDURAL | Status: DC | PRN

## 2019-12-18 MED ORDER — OXYTOCIN BOLUS FROM INFUSION
333.0000 mL | Freq: Once | INTRAVENOUS | Status: AC
  Administered 2019-12-18: 333 mL via INTRAVENOUS

## 2019-12-18 MED ORDER — LACTATED RINGERS IV SOLN: 500.0000 mL | INTRAVENOUS | Status: DC | PRN

## 2019-12-18 MED ORDER — SENNOSIDES-DOCUSATE SODIUM 8.6-50 MG PO TABS
2.0000 | ORAL_TABLET | ORAL | Status: DC
  Administered 2019-12-18: 2 via ORAL
  Filled 2019-12-18: qty 2

## 2019-12-18 MED ORDER — LACTATED RINGERS IV SOLN: INTRAVENOUS | Status: DC

## 2019-12-18 MED ORDER — ZOLPIDEM TARTRATE 5 MG PO TABS: 5.0000 mg | ORAL_TABLET | Freq: Every evening | ORAL | Status: DC | PRN

## 2019-12-18 MED ORDER — FENTANYL CITRATE (PF) 100 MCG/2ML IJ SOLN
100.0000 ug | INTRAMUSCULAR | Status: DC | PRN
  Administered 2019-12-18: 100 ug via INTRAVENOUS
  Filled 2019-12-18: qty 2

## 2019-12-18 MED ORDER — DIPHENHYDRAMINE HCL 25 MG PO CAPS: 25.0000 mg | ORAL_CAPSULE | Freq: Four times a day (QID) | ORAL | Status: DC | PRN

## 2019-12-18 MED ORDER — IBUPROFEN 600 MG PO TABS
600.0000 mg | ORAL_TABLET | Freq: Four times a day (QID) | ORAL | Status: DC
  Administered 2019-12-18 – 2019-12-19 (×4): 600 mg via ORAL
  Filled 2019-12-18 (×3): qty 1

## 2019-12-18 MED ORDER — BENZOCAINE-MENTHOL 20-0.5 % EX AERO
1.0000 "application " | INHALATION_SPRAY | CUTANEOUS | Status: DC | PRN
  Administered 2019-12-18: 1 via TOPICAL
  Filled 2019-12-18: qty 56

## 2019-12-18 MED ORDER — TERBUTALINE SULFATE 1 MG/ML IJ SOLN: 0.2500 mg | Freq: Once | INTRAMUSCULAR | Status: DC | PRN

## 2019-12-18 MED ORDER — ONDANSETRON HCL 4 MG PO TABS: 4.0000 mg | ORAL_TABLET | ORAL | Status: DC | PRN

## 2019-12-18 MED ORDER — ACETAMINOPHEN 325 MG PO TABS: 650.0000 mg | ORAL_TABLET | ORAL | Status: DC | PRN

## 2019-12-18 MED ORDER — OXYTOCIN-SODIUM CHLORIDE 30-0.9 UT/500ML-% IV SOLN
1.0000 m[IU]/min | INTRAVENOUS | Status: DC
  Administered 2019-12-18: 2 m[IU]/min via INTRAVENOUS
  Filled 2019-12-18: qty 500

## 2019-12-18 MED ORDER — PHENYLEPHRINE 40 MCG/ML (10ML) SYRINGE FOR IV PUSH (FOR BLOOD PRESSURE SUPPORT): 80.0000 ug | PREFILLED_SYRINGE | INTRAVENOUS | Status: DC | PRN

## 2019-12-18 MED ORDER — LIDOCAINE HCL (PF) 1 % IJ SOLN
30.0000 mL | INTRAMUSCULAR | Status: AC | PRN
  Administered 2019-12-18: 30 mL via SUBCUTANEOUS
  Filled 2019-12-18: qty 30

## 2019-12-18 MED ORDER — ONDANSETRON HCL 4 MG/2ML IJ SOLN: 4.0000 mg | INTRAMUSCULAR | Status: DC | PRN

## 2019-12-18 MED ORDER — METHYLERGONOVINE MALEATE 0.2 MG PO TABS: 0.2000 mg | ORAL_TABLET | ORAL | Status: DC | PRN

## 2019-12-18 MED ORDER — SIMETHICONE 80 MG PO CHEW: 80.0000 mg | CHEWABLE_TABLET | ORAL | Status: DC | PRN

## 2019-12-18 MED ORDER — ACETAMINOPHEN 325 MG PO TABS
650.0000 mg | ORAL_TABLET | ORAL | Status: DC | PRN
  Administered 2019-12-18 – 2019-12-19 (×2): 650 mg via ORAL
  Filled 2019-12-18 (×2): qty 2

## 2019-12-18 MED ORDER — OXYTOCIN-SODIUM CHLORIDE 30-0.9 UT/500ML-% IV SOLN: 2.5000 [IU]/h | INTRAVENOUS | Status: DC

## 2019-12-18 MED ORDER — PRENATAL MULTIVITAMIN CH
1.0000 | ORAL_TABLET | Freq: Every day | ORAL | Status: DC
  Filled 2019-12-18: qty 1

## 2019-12-18 MED ORDER — DIBUCAINE (PERIANAL) 1 % EX OINT: 1.0000 "application " | TOPICAL_OINTMENT | CUTANEOUS | Status: DC | PRN

## 2019-12-18 MED ORDER — METHYLERGONOVINE MALEATE 0.2 MG/ML IJ SOLN: 0.2000 mg | INTRAMUSCULAR | Status: DC | PRN

## 2019-12-18 MED ORDER — DIPHENHYDRAMINE HCL 50 MG/ML IJ SOLN: 12.5000 mg | INTRAMUSCULAR | Status: DC | PRN

## 2019-12-18 NOTE — H&P (Signed)
Monica Cooke is a 34 y.o. female presenting for postdates IOL. OB History    Gravida  3   Para  2   Term  2   Preterm      AB      Living  2     SAB      TAB      Ectopic      Multiple  0   Live Births  2          Past Medical History:  Diagnosis Date  . Goiter   . Medical history non-contributory   . No pertinent past medical history    Past Surgical History:  Procedure Laterality Date  . RHINOPLASTY     Family History: family history includes Diabetes in her father; Hyperlipidemia in her father and mother; Hypertension in her father and mother; Thyroid disease in her mother. Social History:  reports that she has never smoked. She has never used smokeless tobacco. She reports current alcohol use of about 1.0 - 2.0 standard drink of alcohol per week. She reports that she does not use drugs.     Maternal Diabetes: No Genetic Screening: Normal Maternal Ultrasounds/Referrals: Normal Fetal Ultrasounds or other Referrals:  None Maternal Substance Abuse:  No Significant Maternal Medications:  None Significant Maternal Lab Results:  Group B Strep negative Other Comments:  None  Review of Systems  Constitutional: Negative.   All other systems reviewed and are negative.  Maternal Medical History:  Contractions: Onset was 1-2 hours ago.   Frequency: rare.   Perceived severity is mild.    Fetal activity: Perceived fetal activity is normal.   Last perceived fetal movement was within the past hour.    Prenatal complications: no prenatal complications Prenatal Complications - Diabetes: none.    Dilation: (P) 2.5 Effacement (%): (P) 70 Station: (P) -2 Exam by:: (P) Jonathandavid Marlett Blood pressure 112/71, pulse 74, height 5\' 3"  (1.6 m), weight 83.8 kg, currently breastfeeding. Maternal Exam:  Uterine Assessment: Contraction strength is mild.  Contraction frequency is irregular.   Abdomen: Patient reports no abdominal tenderness. Fetal presentation:  vertex  Introitus: Normal vulva. Normal vagina.  Ferning test: positive.  Nitrazine test: positive. Amniotic fluid character: clear.  Pelvis: adequate for delivery.   Cervix: Cervix evaluated by digital exam.     Physical Exam Vitals and nursing note reviewed.  Constitutional:      Appearance: Normal appearance.  HENT:     Head: Normocephalic and atraumatic.  Cardiovascular:     Rate and Rhythm: Normal rate and regular rhythm.     Pulses: Normal pulses.     Heart sounds: Normal heart sounds.  Pulmonary:     Effort: Pulmonary effort is normal.     Breath sounds: Normal breath sounds.  Genitourinary:    General: Normal vulva.  Musculoskeletal:        General: Normal range of motion.     Cervical back: Normal range of motion and neck supple.  Skin:    General: Skin is warm and dry.  Neurological:     General: No focal deficit present.     Mental Status: She is alert and oriented to person, place, and time.  Psychiatric:        Mood and Affect: Mood normal.        Behavior: Behavior normal.     Prenatal labs: ABO, Rh: O/Positive/-- (01/27 0000) Antibody: Negative (01/27 0000) Rubella: Immune (01/27 0000) RPR: Nonreactive (01/27 0000)  HBsAg: Negative (01/27  0000)  HIV: Non-reactive (01/27 0000)  GBS: Negative/-- (07/21 0000)   Assessment/Plan: Postdates IUP Pitocin IOL   Monica Cooke J 12/18/2019, 8:30 AM

## 2019-12-19 LAB — CBC
HCT: 35.9 % — ABNORMAL LOW (ref 36.0–46.0)
Hemoglobin: 11.6 g/dL — ABNORMAL LOW (ref 12.0–15.0)
MCH: 29.7 pg (ref 26.0–34.0)
MCHC: 32.3 g/dL (ref 30.0–36.0)
MCV: 91.8 fL (ref 80.0–100.0)
Platelets: 155 10*3/uL (ref 150–400)
RBC: 3.91 MIL/uL (ref 3.87–5.11)
RDW: 13.1 % (ref 11.5–15.5)
WBC: 11.8 10*3/uL — ABNORMAL HIGH (ref 4.0–10.5)
nRBC: 0 % (ref 0.0–0.2)

## 2019-12-19 MED ORDER — COCONUT OIL OIL: 1.0000 "application " | TOPICAL_OIL | 0 refills | Status: AC | PRN

## 2019-12-19 MED ORDER — BENZOCAINE-MENTHOL 20-0.5 % EX AERO: 1.0000 "application " | INHALATION_SPRAY | CUTANEOUS | Status: AC | PRN

## 2019-12-19 MED ORDER — ACETAMINOPHEN 500 MG PO TABS: 1000.0000 mg | ORAL_TABLET | Freq: Four times a day (QID) | ORAL | 2 refills | Status: AC | PRN

## 2019-12-19 MED ORDER — IBUPROFEN 600 MG PO TABS: 600.0000 mg | ORAL_TABLET | Freq: Four times a day (QID) | ORAL | 0 refills | Status: AC

## 2019-12-19 NOTE — Discharge Instructions (Signed)
Lactation outpatient support - home visit ° ° °Linda Coppola °RN, MHA, IBCLC °at [facility]: [facility] ° °[email or web address] ° °

## 2019-12-19 NOTE — Discharge Summary (Signed)
OB Discharge Summary  Patient Name: Monica Cooke DOB: 1986/02/27 MRN: [id number]  Date of admission: 12/18/2019 Delivering provider: Olivia Mackie   Admitting diagnosis: Encounter for induction of labor [Z34.90] Intrauterine pregnancy: [redacted]w[redacted]d     Secondary diagnosis: Patient Active Problem List   Diagnosis Date Noted  . Encounter for induction of labor 12/18/2019  . SVD (spontaneous vaginal delivery) 8/16 12/18/2019  . Postpartum care following vaginal delivery 8/16 12/18/2019   Additional problems:none   Date of discharge: 12/19/2019   Discharge diagnosis: Principal Problem:   Postpartum care following vaginal delivery 8/16 Active Problems:   Encounter for induction of labor   SVD (spontaneous vaginal delivery) 8/16                                                              Post partum procedures:none  Augmentation: AROM and Pitocin Pain control: Local  Laceration:None  Episiotomy:None  Complications: None  Hospital course:  Induction of Labor With Vaginal Delivery   34 y.o. yo [id number] at [redacted]w[redacted]d was admitted to the hospital 12/18/2019 for induction of labor.  Indication for induction: Favorable cervix at term.  Patient had an uncomplicated labor course as follows: Membrane Rupture Time/Date: 8:27 AM ,12/18/2019   Delivery Method:Vaginal, Spontaneous  Episiotomy: None  Lacerations:  None  Details of delivery can be found in separate delivery note.  Patient had a routine postpartum course. Patient is discharged home 12/19/19.  Newborn Data: Birth date:12/18/2019  Birth time:11:22 AM  Gender:Female  Living status:Living  Apgars:6 ,9  Weight:3590 g   Physical exam  Vitals:   12/18/19 1430 12/18/19 1850 12/18/19 2235 12/19/19 0228  BP: 102/72 116/71 105/63 112/74  Pulse: 78 (!) 56 66 68  Resp: 18 18 18 18   Temp: 99.1 F (37.3 C) 97.8 F (36.6 C) 97.9 F (36.6 C) 97.9 F (36.6 C)  TempSrc: Oral Axillary Oral Oral  SpO2:   100% 98%  Weight:      Height:        General: alert, cooperative and no distress Lochia: appropriate Uterine Fundus: firm Incision: N/A Perineum: intact DVT Evaluation: No cords or calf tenderness. No significant calf/ankle edema. Labs: Lab Results  Component Value Date   WBC 11.8 (H) 12/19/2019   HGB 11.6 (L) 12/19/2019   HCT 35.9 (L) 12/19/2019   MCV 91.8 12/19/2019   PLT 155 12/19/2019   No flowsheet data found. Edinburgh Postnatal Depression Scale Screening Tool 05/16/2017  I have been able to laugh and see the funny side of things. 0  I have looked forward with enjoyment to things. 0  I have blamed myself unnecessarily when things went wrong. 0  I have been anxious or worried for no good reason. 0  I have felt scared or panicky for no good reason. 0  Things have been getting on top of me. 0  I have been so unhappy that I have had difficulty sleeping. 0  I have felt sad or miserable. 0  I have been so unhappy that I have been crying. 0  The thought of harming myself has occurred to me. 0  Edinburgh Postnatal Depression Scale Total 0   Vaccines: TDaP UTD         Flu    NA  Discharge instruction:  per After Visit  Summary,  [facility] OB booklet and  "[facility]standing Mother [facility] Baby Care" hospital booklet  After Visit Meds:  Allergies as of 12/19/2019      Reactions   Fiber [psyllium] Shortness Of Breath   All fiber supplements      Medication List    TAKE these medications   acetaminophen 500 MG tablet Commonly known as: TYLENOL Take 2 tablets (1,000 mg total) by mouth every 6 (six) hours as needed.   benzocaine-Menthol 20-0.5 % Aero Commonly known as: DERMOPLAST Apply 1 application topically as needed for irritation (perineal discomfort).   coconut oil Oil Apply 1 application topically as needed.   famotidine 20 MG tablet Commonly known as: PEPCID Take 20 mg by mouth 2 (two) times daily.   ibuprofen 600 MG tablet Commonly known as: ADVIL Take 1 tablet (600 mg total) by mouth every 6 (six)  hours.   prenatal multivitamin Tabs tablet Take 1 tablet by mouth at bedtime.            Discharge Care Instructions  (From admission, onward)         Start     Ordered   12/19/19 0000  Discharge wound care:       Comments: Sitz baths 2 times /day with warm water x 1 week. May add herbals: 1 ounce dried comfrey leaf* 1 ounce calendula flowers 1 ounce lavender flowers  Supplies can be found online at [employer] sources at [employer], [employer]  1/2 ounce dried uva ursi leaves 1/2 ounce witch hazel blossoms (if you can find them) 1/2 ounce dried sage leaf 1/2 cup sea salt Directions: Bring 2 quarts of water to a boil. Turn off heat, and place 1 ounce (approximately 1 large handful) of the above mixed herbs (not the salt) into the pot. Steep, covered, for 30 minutes.  Strain the liquid well with a fine mesh strainer, and discard the herb material. Add 2 quarts of liquid to the tub, along with the 1/2 cup of salt. This medicinal liquid can also be made into compresses and peri-rinses.   12/19/19 1204          Diet: routine diet  Activity: Advance as tolerated. Pelvic rest for 6 weeks.   Postpartum contraception: Not Discussed  Newborn Data: Live born female  Birth Weight: 7 lb 14.6 oz (3590 g) APGAR: 6, 9  Newborn Delivery   Birth date/time: 12/18/2019 11:22:00 Delivery type: Vaginal, Spontaneous      named Francesco Runner Baby Feeding: Breast Disposition:home with mother   Delivery Report:  Review the Delivery Report for details.    Follow up:  Follow-up Information    Olivia Mackie, MD. Schedule an appointment as soon as possible for a visit in 6 week(s).   Specialty: Obstetrics and Gynecology Contact information: [address] [place] [address] [phone]                 Signed: Cipriano Mile, MSN 12/19/2019, 12:05 PM

## 2023-08-19 ENCOUNTER — Ambulatory Visit (HOSPITAL_BASED_OUTPATIENT_CLINIC_OR_DEPARTMENT_OTHER)
